# Patient Record
Sex: Female | Born: 1970 | Race: Black or African American | Hispanic: No | Marital: Single | Smoking: Former smoker
Health system: Southern US, Community
[De-identification: ages and names within clinical notes are randomized; demographics above are authoritative.]

## PROBLEM LIST (undated history)

## (undated) DIAGNOSIS — I10 Essential (primary) hypertension: Secondary | ICD-10-CM

## (undated) DIAGNOSIS — Z1231 Encounter for screening mammogram for malignant neoplasm of breast: Principal | ICD-10-CM

---

## 2015-04-04 NOTE — Nursing Note (Signed)
Nursing Discharge Summary - Text       Nursing Discharge Summary Entered On:  04/04/2015 15:49 EST    Performed On:  04/04/2015 15:47 EST by Romeo Apple, RN, Erin               DC Information   Discharge To, Anticipated :   Home independently   Devices/Equipment :   Other: ace wrap   Mode of Discharge :   Ambulatory   Transportation :   Private vehicle   Accompanied By :   Significant other   Romeo Apple RN, Erin - 04/04/2015 15:47 EST   Education   Responsible Learner(s) :   No Data Available     Barriers To Learning :   None evident   Teaching Method :   Explanation, Printed materials   Romeo Apple, RN, Erin - 04/04/2015 15:47 EST   Post-Hospital Education Adult Grid   Diagnostic Results :   Verbalizes understanding   Importance of Follow-Up Visits :   Verbalizes understanding   Pain Management :   Verbalizes understanding   When to Call Health Care Provider :   Trenton Gammon understanding   Romeo Apple, RN, Erin - 04/04/2015 15:47 EST   Education Referral Made To :   Primary Care Physician   Additional Learner(s) Present :   Significant other   Jaynee Eagles - 04/04/2015 15:47 EST

## 2015-09-14 NOTE — Nursing Note (Signed)
Nursing Discharge Summary - Text       Nursing Discharge Summary Entered On:  09/14/2015 11:40 EDT    Performed On:  09/14/2015 11:39 EDT by Romeo Apple, RN, Erin               DC Information   Discharge To, Anticipated :   Other: home with SO   Devices/Equipment :   Axillary crutches, Other: ace wrap   Mode of Discharge :   Wheelchair   Transportation :   Private vehicle   Accompanied By :   Significant other   Romeo Apple, RN, Erin - 09/14/2015 11:39 EDT   Education   Responsible Learner(s) :   No Data Available     Barriers To Learning :   None evident   Teaching Method :   Explanation, Printed materials   Jaynee Eagles - 09/14/2015 11:39 EDT   Post-Hospital Education Adult Grid   Diagnostic Results :   Trenton Gammon understanding   Equipment/Devices :   TEFL teacher understanding   Importance of Follow-Up Visits :   Verbalizes understanding   Pain Management :   Verbalizes understanding   When to Call Health Care Provider :   [employer] understanding   Romeo Apple, RN, Erin - 09/14/2015 11:39 EDT   Medication Education Adult Grid   Med Dosage, Route, Scheduling :   TEFL teacher understanding   Med Generic/Brand Name, Purpose, Action :   Verbalizes understanding   Medication Precautions :   Verbalizes understanding   Romeo Apple, Publishing copy - 09/14/2015 11:39 EDT   Education Referral Made To :   Primary Care Physician   Additional Learner(s) Present :   Significant other   Jaynee Eagles - 09/14/2015 11:39 EDT

## 2017-07-13 ENCOUNTER — Emergency Department (HOSPITAL_COMMUNITY): Payer: No Typology Code available for payment source

## 2017-07-13 ENCOUNTER — Encounter (HOSPITAL_COMMUNITY): Payer: Self-pay | Admitting: Emergency Medicine

## 2017-07-13 ENCOUNTER — Emergency Department (HOSPITAL_COMMUNITY)
Admission: EM | Admit: 2017-07-13 | Discharge: 2017-07-13 | Disposition: A | Discharge status: Home / Self Care | Payer: No Typology Code available for payment source | Attending: Emergency Medicine | Admitting: Emergency Medicine

## 2017-07-13 DIAGNOSIS — Y999 Unspecified external cause status: Secondary | ICD-10-CM | POA: Diagnosis not present

## 2017-07-13 DIAGNOSIS — S0990XA Unspecified injury of head, initial encounter: Secondary | ICD-10-CM | POA: Diagnosis present

## 2017-07-13 DIAGNOSIS — M7918 Myalgia, other site: Secondary | ICD-10-CM

## 2017-07-13 DIAGNOSIS — Z23 Encounter for immunization: Secondary | ICD-10-CM | POA: Insufficient documentation

## 2017-07-13 DIAGNOSIS — S00501A Unspecified superficial injury of lip, initial encounter: Secondary | ICD-10-CM | POA: Insufficient documentation

## 2017-07-13 DIAGNOSIS — M791 Myalgia, unspecified site: Secondary | ICD-10-CM | POA: Insufficient documentation

## 2017-07-13 DIAGNOSIS — Y92481 Parking lot as the place of occurrence of the external cause: Secondary | ICD-10-CM | POA: Diagnosis not present

## 2017-07-13 DIAGNOSIS — S60511A Abrasion of right hand, initial encounter: Secondary | ICD-10-CM | POA: Insufficient documentation

## 2017-07-13 DIAGNOSIS — T07XXXA Unspecified multiple injuries, initial encounter: Secondary | ICD-10-CM

## 2017-07-13 DIAGNOSIS — S60512A Abrasion of left hand, initial encounter: Secondary | ICD-10-CM | POA: Diagnosis not present

## 2017-07-13 DIAGNOSIS — I10 Essential (primary) hypertension: Secondary | ICD-10-CM | POA: Insufficient documentation

## 2017-07-13 DIAGNOSIS — Y9301 Activity, walking, marching and hiking: Secondary | ICD-10-CM | POA: Diagnosis not present

## 2017-07-13 HISTORY — DX: Essential (primary) hypertension: I10

## 2017-07-13 MED ORDER — TETANUS-DIPHTH-ACELL PERTUSSIS 5-2.5-18.5 LF-MCG/0.5 IM SUSP
0.5000 mL | Freq: Once | INTRAMUSCULAR | Status: AC
  Administered 2017-07-13: 0.5 mL via INTRAMUSCULAR
  Filled 2017-07-13: qty 0.5

## 2017-07-13 MED ORDER — ACETAMINOPHEN 500 MG PO TABS
1000.0000 mg | ORAL_TABLET | Freq: Once | ORAL | Status: AC
  Administered 2017-07-13: 1000 mg via ORAL
  Filled 2017-07-13: qty 2
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
## 2017-07-13 NOTE — Discharge Instructions (Addendum)
Please read attached information. If you experience any new or worsening signs or symptoms please return to the emergency room for evaluation. Please follow-up with your primary care provider or specialist as discussed.  °

## 2017-07-13 NOTE — ED Notes (Signed)
Pt ambulated to room with Trey Paula PA without difficulty.
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
## 2017-07-13 NOTE — ED Notes (Signed)
Called for patient for triage with no answer
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
## 2018-12-28 NOTE — ED Notes (Signed)
ED Triage Note       ED Secondary Triage Entered On:  12/28/2018 12:09 EST    Performed On:  12/28/2018 12:09 EST by Dyanne Iha               General Information   Barriers to Learning :   None evident   ED Home Meds Section :   Document assessment   [facility] ED Fall Risk Section :   Document assessment   ED Advance Directives Section :   Document assessment   ED Palliative Screen :   N/A (prefilled for <46yo)   Dyanne Iha - 12/28/2018 12:09 EST   (As Of: 12/28/2018 12:09:23 EST)   Problems(Active)    Chronic hypertension (IMO  :[id number] )  Name of Problem:   Chronic hypertension ; Recorder:   Lars Masson; Confirmation:   Confirmed ; Classification:   Medical ; Code:   [id number] ; Contributor System:   Conservation officer, nature ; Last Updated:   04/04/2015 13:34 EST ; Life Cycle Date:   04/04/2015 ; Life Cycle Status:   Active ; Vocabulary:   IMO          Diagnoses(Active)    Medical screening exam  Date:   12/28/2018 ; Diagnosis Type:   Reason For Visit ; Confirmation:   Complaint of ; Clinical Dx:   Medical screening exam ; Classification:   Medical ; Clinical Service:   Emergency medicine ; Code:   PNED ; Probability:   0 ; Diagnosis Code:   [id number]             -    Procedure History   (As Of: 12/28/2018 12:09:24 EST)     Anesthesia Minutes:   0 ; Procedure Name:   Bilateral tubal ligation ; Procedure Minutes:   0            [facility] Fall Risk Assessment Tool   Hx of falling last 3 months ED Fall :   No   Patient confused or disoriented ED Fall :   No   Patient intoxicated or sedated ED Fall :   No   Patient impaired gait ED Fall :   No   Use a mobility assistance device ED Fall :   No   Patient altered elimination ED Fall :   No   [facility] ED Fall Score :   0    Dyanne Iha - 12/28/2018 12:09 EST   ED Advance Directive   Advance Directive :   No   Dyanne Iha - 12/28/2018 12:09 EST

## 2018-12-28 NOTE — ED Notes (Signed)
ED Triage Note       ED Triage Adult Entered On:  12/28/2018 12:07 EST    Performed On:  12/28/2018 12:02 EST by Cato Mulligan               Triage   Chief Complaint :   Pt has not taken her BP med in three weeks.  denies any symptoms   Numeric Rating Pain Scale :   0 = No pain   [place] Mode of Arrival :   Private vehicle   Infectious Disease Documentation :   Document assessment   Temperature Oral :   36.6 degC(Converted to: 97.9 degF)    Heart Rate Monitored :   60 bpm   Respiratory Rate :   16 br/min   Systolic Blood Pressure :   153 mmHg (HI)    Diastolic Blood Pressure :   97 mmHg (HI)    SpO2 :   100 %   Oxygen Therapy :   Room air   Patient presentation :   None of the above   Chief Complaint or Presentation suggest infection :   No   Dosing Weight Obtained By :   Patient stated   Weight Dosing :   89.5 kg(Converted to: 197 lb 5 oz)    Height :   172 cm(Converted to: 5 ft 8 in)    Body Mass Index Dosing :   30 kg/m2   Cato Mulligan - 12/28/2018 12:02 EST   DCP GENERIC CODE   Tracking Acuity :   4   Tracking Group :   ED [employer] Tracking Group   Cato Mulligan - 12/28/2018 12:02 EST   ED General Section :   Document assessment   Pregnancy Status :   Patient denies   Last Menstrual Period :   12/22/2018 EST   ED Allergies Section :   Document assessment   ED Reason for Visit Section :   Document assessment   ED Home Meds Section :   Document assessment   Cato Mulligan - 12/28/2018 12:02 EST   ID Risk Screen Symptoms   Recent Travel History :   No recent travel   Close Contact with COVID-19 ID :   No   Last 14 days COVID-19 ID :   No   TB Symptom Screen :   No symptoms   C. diff Symptom/History ID :   Neither of the above   Patient Pregnant :   None of the above   MRSA/VRE Screening :   None of these apply   CRE Screening :   Not applicable   Cato Mulligan - 12/28/2018 12:02 EST   Allergies   (As Of: 12/28/2018 12:07:11 EST)   Allergies (Active)   No Known Medication Allergies  Estimated Onset Date:    Unspecified ; Created By:   Fritz Pickerel; Reaction Status:   Active ; Category:   Drug ; Substance:   No Known Medication Allergies ; Type:   Allergy ; Updated By:   Fritz Pickerel; Reviewed Date:   12/28/2018 12:04 EST        Psycho-Social   Last 3 mo, thoughts killing self/others :   Patient denies   Cato Mulligan - 12/28/2018 12:02 EST   ED Home Med List   Medication List   (As Of: 12/28/2018 12:07:11 EST)   Prescription/Discharge Order  diclofenac  :   diclofenac ; Status:   Prescribed ; Ordered As Mnemonic:   diclofenac sodium 75 mg oral delayed release tablet ; Simple Display Line:   75 mg, 1 tabs, Oral, BID, for 7 days, PRN: mild pain (1-3), 15 tabs, 0 Refill(s) ; Ordering Provider:   MOE-MD,  CHRISTOPHER B; Catalog Code:   diclofenac ; Order Dt/Tm:   07/15/2017 16:21:46 EDT            Home Meds    lisinopril  :   lisinopril ; Status:   Documented ; Ordered As Mnemonic:   lisinopril 20 mg oral tablet ; Simple Display Line:   20 mg, 1 tabs, Oral, Daily, 0 Refill(s) ; Catalog Code:   lisinopril ; Order Dt/Tm:   04/04/2015 13:34:42 EST            ED Reason for Visit   (As Of: 12/28/2018 12:07:11 EST)   Problems(Active)    Chronic hypertension (IMO  :[id number] )  Name of Problem:   Chronic hypertension ; Recorder:   Fritz Pickerel; Confirmation:   Confirmed ; Classification:   Medical ; Code:   [id number] ; Contributor System:   Dietitian ; Last Updated:   04/04/2015 13:34 EST ; Life Cycle Date:   04/04/2015 ; Life Cycle Status:   Active ; Vocabulary:   IMO          Diagnoses(Active)    Medical screening exam  Date:   12/28/2018 ; Diagnosis Type:   Reason For Visit ; Confirmation:   Complaint of ; Clinical Dx:   Medical screening exam ; Classification:   Medical ; Clinical Service:   Emergency medicine ; Code:   PNED ; Probability:   0 ; Diagnosis Code:   [id number]

## 2018-12-28 NOTE — ED Notes (Signed)
 ED Patient Education Note     Patient Education Materials Follows:  Cardiovascular     DASH Eating Plan    DASH stands for Dietary Approaches to Stop Hypertension. The DASH eating plan is a healthy eating plan that has been shown to reduce high blood pressure (hypertension). Additional health benefits may include reducing the risk of type 2 diabetes mellitus, heart disease, and stroke. The DASH eating plan may also help with weight loss.    WHAT DO I NEED TO KNOW ABOUT THE DASH EATING PLAN?    For the DASH eating plan, you will follow these general guidelines:     Choose foods with a percent daily value for sodium of less than 5% (as listed on the food label).     Use salt-free seasonings or herbs instead of table salt or sea salt.     Check with your health care provider or pharmacist before using salt substitutes.      Eat lower-sodium products, often labeled as lower sodium or no salt added.      Eat fresh foods.     Eat more vegetables, fruits, and low-fat dairy products.     Choose whole grains. Look for the word whole as the first word in the ingredient list.     Choose fish and skinless chicken or [place] more often than red meat. Limit fish, poultry, and meat to 6 oz (170 g) each day.     Limit sweets, desserts, sugars, and sugary drinks.     Choose heart-healthy fats.      Limit cheese to 1 oz (28 g) per day.     Eat more home-cooked food and less restaurant, buffet, and fast food.      Limit fried foods.     Cook foods using methods other than frying.     Limit canned vegetables. If you do use them, rinse them well to decrease the sodium.     When eating at a restaurant, ask that your food be prepared with less salt, or no salt if possible.     WHAT FOODS CAN I EAT?    Seek help from a dietitian for individual calorie needs.    Grains    Whole grain or whole wheat bread. Brown rice. Whole grain or whole wheat pasta. Quinoa, bulgur, and whole grain cereals. Low-sodium cereals. Corn or whole wheat  flour tortillas. Whole grain cornbread. Whole grain crackers. Low-sodium crackers.    Vegetables    Fresh or frozen vegetables (raw, steamed, roasted, or grilled). Low-sodium or reduced-sodium tomato and vegetable juices. Low-sodium or reduced-sodium tomato sauce and paste. Low-sodium or reduced-sodium canned vegetables.     Fruits    All fresh, canned (in natural juice), or frozen fruits.    Meat and Other Protein Products    Ground beef (85% or leaner), grass-fed beef, or beef trimmed of fat. Skinless chicken or [place]. Ground chicken or [place]. Pork trimmed of fat. All fish and seafood. Eggs. Dried beans, peas, or lentils. Unsalted nuts and seeds. Unsalted canned beans.    Dairy    Low-fat dairy products, such as skim or 1% milk, 2% or reduced-fat cheeses, low-fat ricotta or cottage cheese, or plain low-fat yogurt. Low-sodium or reduced-sodium cheeses.    Fats and Oils    Tub margarines without trans fats. Light or reduced-fat mayonnaise and salad dressings (reduced sodium). Avocado. Safflower, olive, or canola oils. Natural peanut or almond butter.    Other    Unsalted popcorn and  pretzels.    The items listed above may not be a complete list of recommended foods or beverages. Contact your dietitian for more options.    WHAT FOODS ARE NOT RECOMMENDED?    Grains    White bread. White pasta. White rice. Refined cornbread. Bagels and croissants. Crackers that contain trans fat.    Vegetables    Creamed or fried vegetables. Vegetables in a cheese sauce. Regular canned vegetables. Regular canned tomato sauce and paste. Regular tomato and vegetable juices.    Fruits    Dried fruits. Canned fruit in light or heavy syrup. Fruit juice.    Meat and Other Protein Products    Fatty cuts of meat. Ribs, chicken wings, bacon, sausage, bologna, salami, chitterlings, fatback, hot dogs, bratwurst, and packaged luncheon meats. Salted nuts and seeds. Canned beans with salt.    Dairy    Whole or 2% milk, cream, half-and-half, and  cream cheese. Whole-fat or sweetened yogurt. Full-fat cheeses or blue cheese. Nondairy creamers and whipped toppings. Processed cheese, cheese spreads, or cheese curds.    Condiments    Onion and garlic salt, seasoned salt, table salt, and sea salt. Canned and packaged gravies. Worcestershire sauce. Tartar sauce. Barbecue sauce. Teriyaki sauce. Soy sauce, including reduced sodium. Steak sauce. Fish sauce. Oyster sauce. Cocktail sauce. Horseradish. Ketchup and mustard. Meat flavorings and tenderizers. Bouillon cubes. Hot sauce. Tabasco sauce. Marinades. Taco seasonings. Relishes.    Fats and Oils    Butter, stick margarine, lard, shortening, ghee, and bacon fat. Coconut, palm kernel, or palm oils. Regular salad dressings.    Other    Pickles and olives. Salted popcorn and pretzels.    The items listed above may not be a complete list of foods and beverages to avoid. Contact your dietitian for more information.    WHERE CAN I FIND MORE INFORMATION?    National [employer], [employer], [employer]: [employer]    This information is not intended to replace advice given to you by your health care provider. Make sure you discuss any questions you have with your health care provider.    Document Released: 01/21/2011 Document Revised: 02/22/2014 Document Reviewed: 12/06/2012  Elsevier Interactive Patient Education ?2016 Else[employer] Inc.         Hypertension    Hypertension is another name for high blood pressure. High blood pressure forces your heart to work harder to pump blood. A blood pressure reading has two numbers, which includes a higher number over a lower number (example: 110/72).      HOME CARE     Have your blood pressure rechecked by your doctor.     Only take medicine as told by your doctor. Follow the directions carefully. The medicine does not work as well if you skip doses. Skipping doses also puts you at risk for problems.      Do not smoke.     Monitor your blood pressure  at home as told by your doctor.    GET HELP IF:     You think you are having a reaction to the medicine you are taking.     You have repeat headaches or feel dizzy.     You have puffiness (swelling) in your ankles.     You have trouble with your vision.    GET HELP RIGHT AWAY IF:     You get a very bad headache and are confused.     You feel weak, numb, or faint.     You get  chest or belly (abdominal) pain.     You throw up (vomit).     You cannot breathe very well.    MAKE SURE YOU:     Understand these instructions.     Will watch your condition.     Will get help right away if you are not doing well or get worse.    This information is not intended to replace advice given to you by your health care provider. Make sure you discuss any questions you have with your health care provider.    Document Released: 07/21/2007 Document Revised: 02/06/2013 Document Reviewed: 11/24/2012  Elsevier Interactive Patient Education ?2016 Elsevier Inc.

## 2018-12-28 NOTE — ED Provider Notes (Signed)
Medical screening exam        Patient:   Elizabeth Bruce, Elizabeth Bruce            MRN: [id number]            FIN: [id number]               Age:   48 years     Sex:  Female     DOB:  08-26-1970   Associated Diagnoses:   Medication refill; Hypertension; Non-compliance with treatment   Author:   Maudie Flakes      Basic Information   Additional information: Chief Complaint from Nursing Triage Note   Chief Complaint  Chief Complaint: Pt has not taken her BP med in three weeks.  denies any symptoms (12/28/18 12:02:00).      History of Present Illness   The patient presents with 48 year old female with history of hypertension presents with request for blood pressure medication refill.  She states she has been out of her blood pressure medication for 3 weeks.  She does have an upcoming appointment with the [facility] clinic in 3 weeks.  She denies any current symptoms.  She denies fever, chills, syncope, near syncope, headaches, altered mental status, stroke symptoms, chest pain or pressure, palpitations, shortness of breath, nausea, vomiting, urinary symptoms, pregnancy.        Review of Systems             Additional review of systems information: All other systems reviewed and otherwise negative.      Health Status   Allergies:    Allergic Reactions (Selected)  No Known Medication Allergies.      Past Medical/ Family/ Social History   Medical history: Reviewed as documented in chart.   Surgical history: Reviewed as documented in chart.   Family history: Not significant.   Social history: Reviewed as documented in chart.   Problem list:    Active Problems (1)  Chronic hypertension   .      Physical Examination               Vital Signs   Vital Signs   12/28/2018 12:09 EST Temperature Oral 36.6 degC   07/37/1062 69:48 EST Systolic Blood Pressure 546 mmHg  HI    Diastolic Blood Pressure 97 mmHg  HI    Temperature Oral 36.6 degC    Heart Rate Monitored 60 bpm    Respiratory Rate 16 br/min    SpO2 100 %   .   Measurements    12/28/2018 12:07 EST Body Mass Index est meas 30.25 kg/m2    Body Mass Index Measured 30.25 kg/m2   12/28/2018 12:02 EST Height/Length Measured 172 cm    Weight Dosing 89.5 kg   .   Basic Oxygen Information   12/28/2018 12:02 EST SpO2 100 %    Oxygen Therapy Room air   .   General:  Alert, no acute distress, Well-appearing.    Skin:  Warm, dry.    Cardiovascular:  Regular rate and rhythm, No murmur, Normal peripheral perfusion.    Respiratory:  Lungs are clear to auscultation, respirations are non-labored, breath sounds are equal.    Neurological:  Alert and oriented to person, place, time, and situation, normal speech observed.    Psychiatric:  Cooperative, appropriate mood & affect.       Medical Decision Making   Documents reviewed:  Emergency department nurses' notes.   Notes:  Patient is afebrile, hemodynamically stable and in  good condition.  She is well-appearing.  Nontoxic-appearing.  No signs of distress.  Request medication refill.  Denies any current symptoms.  We will provide her with a refill for her blood pressure medication.  Recommend follow-up as scheduled with her primary care provider.  Return to the ER immediately for any new or worsening symptoms.      Reexamination/ Reevaluation   Vital signs   Basic Oxygen Information   12/28/2018 12:02 EST SpO2 100 %    Oxygen Therapy Room air         Impression and Plan   Diagnosis   Medication refill (ICD10-CM Z76.0, Discharge, Medical)   Hypertension (ICD10-CM I10, Discharge, Medical)   Non-compliance with treatment (ICD10-CM Z91.19, Discharge, Medical)   Plan   Condition: Stable.    Disposition: Discharged: to home.    Prescriptions: [facility] prescriptions   Pharmacy:  lisinopril-hydrochlorothiazide 20 mg-12.5 mg oral tablet (Prescribe): 1 tabs, Oral, Daily, for 30 days, 30 tabs, 0 Refill(s).    Patient was given the following educational materials: Hypertension, Easy-to-Read, DASH Eating Plan.    Follow up with: [name] Within 1 to 2 weeks  Return to ED if symptoms worsen.    Counseled: I had a detailed discussion with the patient and/or guardian regarding the historical points/exam findings supporting the discharge diagnosis and need for outpatient followup. Discussed the need to return to the ER if symptoms persist/worsen, or for any questions/concerns that arise at home.    Signature Line     Electronically Signed on 12/28/2018 12:17 PM EST   ________________________________________________   Gearldine Bienenstock      Electronically Signed on 12/28/2018 12:36 PM EST   ________________________________________________   [place] Ruddle

## 2018-12-28 NOTE — Discharge Summary (Signed)
ED Clinical Summary                     [facility]  [address]  [place], [place] [address]  [phone]          PERSON INFORMATION  Name: Elizabeth Bruce, Elizabeth Bruce Age:  48 Years DOB: 1970/06/14   Sex: Female Language: English PCP: Elizabeth Bruce   Marital Status: Single Phone: [phone] Med Service: MED-Medicine   MRN: [id number] Acct# [id number] Arrival: 12/28/2018 11:57:00   Visit Reason: Medical screening exam; HBP Acuity: 4 LOS: 000 00:17   Address:    [address] [place] [address]   Diagnosis:    Hypertension; Medication refill; Non-compliance with treatment  Medications:          New Medications  Printed Prescriptions  lisinopril-hydrochlorothiazide (lisinopril-hydrochlorothiazide 20 mg-12.5 mg oral tablet) 1 Tabs Oral (given by mouth) every day for 30 Days. Refills: 0.  Last Dose:____________________  Medications that have not changed  Other Medications  diclofenac (diclofenac sodium 75 mg oral delayed release tablet) 1 Tabs Oral (given by mouth) 2 times a day as needed mild pain (1-3) for 7 Days. Refills: 0.  Last Dose:____________________  lisinopril (lisinopril 20 mg oral tablet) 1 Tabs Oral (given by mouth) every day.  Last Dose:____________________      Medications Administered During Visit:              Allergies      No Known Medication Allergies      Major Tests and Procedures:  The following procedures and tests were performed during your ED visit.  COMMON PROCEDURES%>  COMMON PROCEDURES COMMENTS%>                PROVIDER INFORMATION               Provider Role Assigned Threasa Heads ED Nurse 12/28/2018 12:02:55    Gearldine Bienenstock ED MidLevel 12/28/2018 12:04:04        Attending Physician:  DEFAULT,  DOCTOR      Admit Doc  DEFAULT,  DOCTOR     Consulting Doc       VITALS INFORMATION  Vital Sign Triage Latest   Temp Oral ORAL_1%> ORAL%>   Temp Temporal TEMPORAL_1%> TEMPORAL%>   Temp Intravascular  INTRAVASCULAR_1%> INTRAVASCULAR%>   Temp Axillary AXILLARY_1%> AXILLARY%>   Temp Rectal RECTAL_1%> RECTAL%>   02 Sat 100 % 100 %   Respiratory Rate RATE_1%> RATE%>   Peripheral Pulse Rate PULSE RATE_1%> PULSE RATE%>   Apical Heart Rate HEART RATE_1%> HEART RATE%>   Blood Pressure BLOOD PRESSURE_1%>/ BLOOD PRESSURE_1%>97 mmHg BLOOD PRESSURE%> / BLOOD PRESSURE%>97 mmHg                 Immunizations      No Immunizations Documented This Visit          DISCHARGE INFORMATION   Discharge Disposition: H Outpt-Sent Home   Discharge Location:  Home   Discharge Date and Time:  12/28/2018 12:14:37   ED Checkout Date and Time:  12/28/2018 12:14:37     DEPART REASON INCOMPLETE INFORMATION               Depart Action Incomplete Reason   Interactive View/I&O Recently assessed               Problems      Active           Chronic  hypertension              Smoking Status      Never smoker         PATIENT EDUCATION INFORMATION  Instructions:     DASH Eating Plan; Hypertension, Easy-to-Read     Follow up:                   With: Address: When:   Elizabeth Bruce [address] [place], [place] [address]  [phone] Business (1) Within 1 to 2 weeks   Comments:   Return to ED if symptoms worsen              ED PROVIDER DOCUMENTATION

## 2018-12-28 NOTE — ED Notes (Signed)
 ED Patient Summary       ;       [facility]  [address], [place], [place] [address]  [phone]  Discharge Instructions (Patient)  _______________________________________     Name: Elizabeth Bruce, Elizabeth Bruce  DOB: 1971-01-06                   MRN: [id number]                   FIN: [id number]  Reason For Visit: Medical screening exam; HBP  Final Diagnosis: Hypertension; Medication refill; Non-compliance with treatment     Visit Date: 12/28/2018 11:57:00  Address: [address] [place] [facility] [address]  Phone: [phone]     Emergency Department Providers:        Primary Physician:            [facility] would like to thank you for allowing us  to assist you with your healthcare needs. The following includes patient education materials and information regarding your injury/illness.     Follow-up Instructions:  You were seen today on an emergency basis. Please contact your primary care doctor for a follow up appointment. If you received a referral to a specialist doctor, it is important you follow-up as instructed.    It is important that you call your follow-up doctor to schedule and confirm the location of your next appointment. Your doctor may practice at multiple locations. The office location of your follow-up appointment may be different to the one written on your discharge instructions.    If you do not have a primary care doctor, please call [phone] for help in finding a [facility] Provider. For help in finding a specialist doctor, please call [phone].    The [facility] "Ask a Nurse" line in staffed by Registered Nurses and is a free service to the community. We are available Monday - Friday from 8am to 5pm to answer your questions about your health. Please call [phone].    If your condition gets worse before your follow-up with your primary care doctor or specialist, please return to the  Emergency Department.        Follow Up Appointments:  Primary Care Provider:      Name: NEHEMIAH DUNNINGS      Phone: [phone]                 With: Address: When:   Johnie JAYSON [place] [address] [place], [place] [address]  [phone] Business (1) Within 1 to 2 weeks   Comments:   Return to ED if symptoms worsen              Printed Prescriptions:    Patient Education Materials:  Discharge Orders          Discharge Patient 12/28/18 12:12:00 EST         Comment:      DASH Eating Plan; Hypertension, Easy-to-Read     DASH Eating Plan    DASH stands for Dietary Approaches to Stop Hypertension. The DASH eating plan is a healthy eating plan that has been shown to reduce high blood pressure (hypertension). Additional health benefits may include reducing the risk of type 2 diabetes mellitus, heart disease, and stroke. The DASH eating plan may also help with weight loss.    WHAT DO I NEED TO KNOW ABOUT THE DASH EATING PLAN?  For the DASH eating plan, you will follow these general guidelines:     Choose foods with a percent daily value for sodium of less than 5% (as listed on the food label).     Use salt-free seasonings or herbs instead of table salt or sea salt.     Check with your health care provider or pharmacist before using salt substitutes.      Eat lower-sodium products, often labeled as lower sodium or no salt added.      Eat fresh foods.     Eat more vegetables, fruits, and low-fat dairy products.     Choose whole grains. Look for the word whole as the first word in the ingredient list.     Choose fish and skinless chicken or [place] more often than red meat. Limit fish, poultry, and meat to 6 oz (170 g) each day.     Limit sweets, desserts, sugars, and sugary drinks.     Choose heart-healthy fats.      Limit cheese to 1 oz (28 g) per day.     Eat more home-cooked food and less restaurant, buffet, and fast food.      Limit fried foods.     Cook foods using methods other than frying.      Limit canned vegetables. If you do use them, rinse them well to decrease the sodium.     When eating at a restaurant, ask that your food be prepared with less salt, or no salt if possible.     WHAT FOODS CAN I EAT?    Seek help from a dietitian for individual calorie needs.    Grains    Whole grain or whole wheat bread. Brown rice. Whole grain or whole wheat pasta. Quinoa, bulgur, and whole grain cereals. Low-sodium cereals. Corn or whole wheat flour tortillas. Whole grain cornbread. Whole grain crackers. Low-sodium crackers.    Vegetables    Fresh or frozen vegetables (raw, steamed, roasted, or grilled). Low-sodium or reduced-sodium tomato and vegetable juices. Low-sodium or reduced-sodium tomato sauce and paste. Low-sodium or reduced-sodium canned vegetables.     Fruits    All fresh, canned (in natural juice), or frozen fruits.    Meat and Other Protein Products    Ground beef (85% or leaner), grass-fed beef, or beef trimmed of fat. Skinless chicken or [place]. Ground chicken or [place]. Pork trimmed of fat. All fish and seafood. Eggs. Dried beans, peas, or lentils. Unsalted nuts and seeds. Unsalted canned beans.    Dairy    Low-fat dairy products, such as skim or 1% milk, 2% or reduced-fat cheeses, low-fat ricotta or cottage cheese, or plain low-fat yogurt. Low-sodium or reduced-sodium cheeses.    Fats and Oils    Tub margarines without trans fats. Light or reduced-fat mayonnaise and salad dressings (reduced sodium). Avocado. Safflower, olive, or canola oils. Natural peanut or almond butter.    Other    Unsalted popcorn and pretzels.    The items listed above may not be a complete list of recommended foods or beverages. Contact your dietitian for more options.    WHAT FOODS ARE NOT RECOMMENDED?    Grains    White bread. White pasta. White rice. Refined cornbread. Bagels and croissants. Crackers that contain trans fat.    Vegetables    Creamed or fried vegetables. Vegetables in a cheese sauce. Regular canned  vegetables. Regular canned tomato sauce and paste. Regular tomato and vegetable juices.    Fruits    Dried  fruits. Canned fruit in light or heavy syrup. Fruit juice.    Meat and Other Protein Products    Fatty cuts of meat. Ribs, chicken wings, bacon, sausage, bologna, salami, chitterlings, fatback, hot dogs, bratwurst, and packaged luncheon meats. Salted nuts and seeds. Canned beans with salt.    Dairy    Whole or 2% milk, cream, half-and-half, and cream cheese. Whole-fat or sweetened yogurt. Full-fat cheeses or blue cheese. Nondairy creamers and whipped toppings. Processed cheese, cheese spreads, or cheese curds.    Condiments    Onion and garlic salt, seasoned salt, table salt, and sea salt. Canned and packaged gravies. Worcestershire sauce. Tartar sauce. Barbecue sauce. Teriyaki sauce. Soy sauce, including reduced sodium. Steak sauce. Fish sauce. Oyster sauce. Cocktail sauce. Horseradish. Ketchup and mustard. Meat flavorings and tenderizers. Bouillon cubes. Hot sauce. Tabasco sauce. Marinades. Taco seasonings. Relishes.    Fats and Oils    Butter, stick margarine, lard, shortening, ghee, and bacon fat. Coconut, palm kernel, or palm oils. Regular salad dressings.    Other    Pickles and olives. Salted popcorn and pretzels.    The items listed above may not be a complete list of foods and beverages to avoid. Contact your dietitian for more information.    WHERE CAN I FIND MORE INFORMATION?    National Heart, Lung, and Blood Institute: CableProm[employer].it    This information is not intended to replace advice given to you by your health care provider. Make sure you discuss any questions you have with your health care provider.    Document Released: 01/21/2011 Document Revised: 02/22/2014 Document Reviewed: 12/06/2012  Elsevier Interactive Patient Education ?2016 Elsevier Inc.       Hypertension    Hypertension is another name for high blood pressure. High blood pressure forces your  heart to work harder to pump blood. A blood pressure reading has two numbers, which includes a higher number over a lower number (example: 110/72).      HOME CARE     Have your blood pressure rechecked by your doctor.     Only take medicine as told by your doctor. Follow the directions carefully. The medicine does not work as well if you skip doses. Skipping doses also puts you at risk for problems.      Do not smoke.     Monitor your blood pressure at home as told by your doctor.    GET HELP IF:     You think you are having a reaction to the medicine you are taking.     You have repeat headaches or feel dizzy.     You have puffiness (swelling) in your ankles.     You have trouble with your vision.    GET HELP RIGHT AWAY IF:     You get a very bad headache and are confused.     You feel weak, numb, or faint.     You get chest or belly (abdominal) pain.     You throw up (vomit).     You cannot breathe very well.    MAKE SURE YOU:     Understand these instructions.     Will watch your condition.     Will get help right away if you are not doing well or get worse.    This information is not intended to replace advice given to you by your health care provider. Make sure you discuss any questions you have with your health care provider.    Document  Released: 07/21/2007 Document Revised: 02/06/2013 Document Reviewed: 11/24/2012  Elsevier Interactive Patient Education ?2016 Elsevier Inc.         Allergy Info: No Known Medication Allergies     Medication Information:  [facility] provided you with a complete list of medications post discharge, if you have been instructed to stop taking a medication please ensure you also follow up with this information to your Primary Care Physician.  Unless otherwise noted, patient will continue to take medications as prescribed prior to the Emergency Room visit.  Any specific questions regarding your chronic medications and dosages should be discussed with your  physician(s) and pharmacist.          diclofenac (diclofenac sodium 75 mg oral delayed release tablet) 1 Tabs Oral (given by mouth) 2 times a day as needed mild pain (1-3) for 7 Days. Refills: 0.  lisinopril (lisinopril 20 mg oral tablet) 1 Tabs Oral (given by mouth) every day.  lisinopril-hydrochlorothiazide  (lisinopril-hydrochlorothiazide  20 mg-12.5 mg oral tablet) 1 Tabs Oral (given by mouth) every day for 30 Days. Refills: 0.      Medications Administered During Visit:       Major Tests and Procedures:  The following procedures and tests were performed during your Emergency Room visit.  COMMON PROCEDURES%>  COMMON PROCEDURES COMMENTS%>          Laboratory Orders  No laboratory orders were placed.              Radiology Orders  No radiology orders were placed.              Patient Care Orders  Name Status Details   Discharge Patient Ordered 12/28/18 12:12:00 EST   ED Assessment Adult Completed 12/28/18 12:07:12 EST, 12/28/18 12:07:12 EST   ED Secondary Triage Completed 12/28/18 12:07:12 EST, 12/28/18 12:07:12 EST   ED Triage Adult Completed 12/28/18 11:58:02 EST, 12/28/18 11:58:02 EST       ---------------------------------------------------------------------------------------------------------------------  [facility]) encourages you to self-enroll in the [facility] Patient Portal.  [facility] Patient Portal will allow you to manage your personal health information securely from your own electronic device now and in the future.  To begin your Patient Portal enrollment process, please visit [email or web address] Click on "Sign up now" under [facility].  If you find that you need additional assistance on the [facility] Patient Portal or need a copy of your medical records, please call the [facility] Medical Records Office at [phone].  Comment:

## 2019-03-14 LAB — COVID-19, SURVEILLANCE (ASYMPTOMATIC/NO EXPOSURE, OR TEST OF CURE)
Lot/Kit Number: 706281
Lot/Kit expire date:: 9152021
SARS Cov2 Ag FIA: POSITIVE — AB

## 2019-03-14 NOTE — ED Notes (Signed)
ED Triage Note       ED Triage Adult Entered On:  03/14/2019 14:29 EST    Performed On:  03/14/2019 14:24 EST by Callie Fielding, RN, RACHAEL M               Triage   Chief Complaint :   c/o sinus pain, sinus drainage,cough- cant smell or taste after using nasal spray-sx x 3 days- wants covid test   Numeric Rating Pain Scale :   4   ED Pain Details :   Pain Details   [place] Mode of Arrival :   Walking   Infectious Disease Documentation :   Document assessment   Temperature Oral :   37.3 degC(Converted to: 99.1 degF)    Heart Rate Monitored :   96 bpm   Respiratory Rate :   15 br/min   Systolic Blood Pressure :   167 mmHg (HI)    Diastolic Blood Pressure :   112 mmHg (>HHI)    SpO2 :   96 %   Oxygen Therapy :   Room air   Patient presentation :   None of the above   Chief Complaint or Presentation suggest infection :   Yes   Dosing Weight Obtained By :   Patient stated   Weight Dosing :   89.5 kg(Converted to: 197 lb 5 oz)    Height :   172.7 cm(Converted to: 5 ft 8 in)    Body Mass Index Dosing :   30 kg/m2   Verrochi, RN, [place] M - 03/14/2019 14:24 EST   DCP GENERIC CODE   Tracking Acuity :   4   Tracking Group :   ED [place], RN, [employer] M - 03/14/2019 14:24 EST   ED General Section :   Document assessment   Pregnancy Status :   Patient denies   ED Allergies Section :   Document assessment   ED Reason for Visit Section :   Document assessment   Verrochi, RN, Thora Lance - 03/14/2019 14:24 EST   ID Risk Screen Symptoms   Recent Travel History :   No recent travel   Close Contact with COVID-19 ID :   No   Last 14 days COVID-19 ID :   No   TB Symptom Screen :   Cough AND Fever OR Chills   C. diff Symptom/History ID :   Neither of the above   Verrochi, RN, RACHAEL M - 03/14/2019 14:24 EST   ID TB Screen   Hemoptysis (Blood in Sputum) :   No   Night Sweats :   No   Weight Loss Greater Than 10 Pounds :   No   Hx of TB Now or at Any Time in the Past :   No   Verrochi, RN, RACHAEL M - 03/14/2019 14:24 EST    Allergies   (As Of: 03/14/2019 14:29:03 EST)   Allergies (Active)   No Known Medication Allergies  Estimated Onset Date:   Unspecified ; Created By:   Lars Masson; Reaction Status:   Active ; Category:   Drug ; Substance:   No Known Medication Allergies ; Type:   Allergy ; Updated By:   Lars Masson; Reviewed Date:   03/14/2019 14:25 EST        Pain Assessment   Preferred Pain Tool :   Numeric rating scale   Numeric Rating With Activity :   4  Numeric Rating Score With Activity :   4    Laterality :   Bilateral   Pain Location :   Other: sinus    Verrochi, RN, Earlene Plater - 03/14/2019 14:24 EST   Image 4 -  Images currently included in the form version of this document have not been included in the text rendition version of the form.   Psycho-Social   Last 3 mo, thoughts killing self/others :   Patient denies   Injuries/Abuse/Neglect in Household :   Denies   Feels Unsafe at Home :   No   ED Behavioral Activity Rating Scale :   4 - Quiet and awake (normal level of activity)   Verrochi, RN, Earlene Plater - 03/14/2019 14:24 EST   ED Reason for Visit   (As Of: 03/14/2019 14:29:03 EST)   Problems(Active)    Chronic hypertension (IMO  :[id number] )  Name of Problem:   Chronic hypertension ; Recorder:   Fritz Pickerel; Confirmation:   Confirmed ; Classification:   Medical ; Code:   [id number] ; Contributor System:   Dietitian ; Last Updated:   04/04/2015 13:34 EST ; Life Cycle Date:   04/04/2015 ; Life Cycle Status:   Active ; Vocabulary:   IMO          Diagnoses(Active)    Sinus Pain/Congestion  Date:   03/14/2019 ; Diagnosis Type:   Reason For Visit ; Confirmation:   Complaint of ; Clinical Dx:   Sinus Pain/Congestion ; Classification:   Medical ; Clinical Service:   Emergency medicine ; Code:   PNED ; Probability:   0 ; Diagnosis Code:   [phone]

## 2019-03-14 NOTE — ED Notes (Signed)
ED Triage Note       ED Secondary Triage Entered On:  03/14/2019 14:41 EST    Performed On:  03/14/2019 14:39 EST by Patrick North               General Information   Barriers to Learning :   None evident   Languages :   English   Influenza Vaccine Status :   Not received   ED Home Meds Section :   Document assessment   [facility] ED Fall Risk Section :   Document assessment   ED Advance Directives Section :   Document assessment   ED Palliative Screen :   N/A (prefilled for <65yo)   Patrick North - 03/14/2019 14:39 EST   (As Of: 03/14/2019 14:41:18 EST)   Problems(Active)    Chronic hypertension (IMO  :[id number] )  Name of Problem:   Chronic hypertension ; Recorder:   Fritz Pickerel; Confirmation:   Confirmed ; Classification:   Medical ; Code:   [id number] ; Contributor System:   Dietitian ; Last Updated:   04/04/2015 13:34 EST ; Life Cycle Date:   04/04/2015 ; Life Cycle Status:   Active ; Vocabulary:   IMO          Diagnoses(Active)    Sinus Pain/Congestion  Date:   03/14/2019 ; Diagnosis Type:   Reason For Visit ; Confirmation:   Complaint of ; Clinical Dx:   Sinus Pain/Congestion ; Classification:   Medical ; Clinical Service:   Emergency medicine ; Code:   PNED ; Probability:   0 ; Diagnosis Code:   [phone]             -    Procedure History   (As Of: 03/14/2019 14:41:18 EST)     Anesthesia Minutes:   0 ; Procedure Name:   Bilateral tubal ligation ; Procedure Minutes:   0            [facility] Fall Risk Assessment Tool   Hx of falling last 3 months ED Fall :   No   Patient confused or disoriented ED Fall :   No   Patient intoxicated or sedated ED Fall :   No   Patient impaired gait ED Fall :   No   Use a mobility assistance device ED Fall :   No   Patient altered elimination ED Fall :   No   [facility] ED Fall Score :   0    Patrick North - 03/14/2019 14:39 EST   ED Advance Directive   Advance Directive :   No   Patrick North - 03/14/2019 14:39 EST   Med Hx   Medication List   (As Of:  03/14/2019 14:41:18 EST)   Prescription/Discharge Order    lisinopril-hydrochlorothiazide  :   lisinopril-hydrochlorothiazide ; Status:   Completed ; Ordered As Mnemonic:   lisinopril-hydrochlorothiazide 20 mg-12.5 mg oral tablet ; Simple Display Line:   1 tabs, Oral, Daily, for 30 days, 30 tabs, 0 Refill(s) ; Ordering Provider:   Gearldine Bienenstock; Catalog Code:   lisinopril-hydrochlorothiazide ; Order Dt/Tm:   12/28/2018 12:11:58 EST          diclofenac  :   diclofenac ; Status:   Completed ; Ordered As Mnemonic:   diclofenac sodium 75 mg oral delayed release tablet ; Simple Display Line:   75 mg, 1 tabs, Oral, BID, for 7 days, PRN: mild pain (  1-3), 15 tabs, 0 Refill(s) ; Ordering Provider:   MOE-MD,  CHRISTOPHER B; Catalog Code:   diclofenac ; Order Dt/Tm:   07/15/2017 16:21:46 EDT            Home Meds    lisinopril  :   lisinopril ; Status:   Documented ; Ordered As Mnemonic:   lisinopril 20 mg oral tablet ; Simple Display Line:   20 mg, 1 tabs, Oral, Daily, 0 Refill(s) ; Catalog Code:   lisinopril ; Order Dt/Tm:   04/04/2015 13:34:42 EST

## 2019-03-14 NOTE — Discharge Summary (Signed)
ED Clinical Summary                     [address]  [address]  [place], [place] [address]  [phone]          PERSON INFORMATION  Name: Elizabeth Bruce, Elizabeth Bruce Age:  49 Years DOB: Apr 19, 1970   Sex: Female Language: English PCP: Rodney Booze   Marital Status: Single Phone: [phone] Med Service: Harle Stanford   MRN: [id number] Acct# [id number] Arrival: 03/14/2019 14:22:00   Visit Reason: Sinus Pain/Congestion; WANTS COVID TEST, LOSS OF SMELL AND TASTE Acuity: 4 LOS: 000 01:19   Address:    [address] [place] [address]   Diagnosis:    COVID-19 virus infection  Medications:          Medications that have not changed  Other Medications  lisinopril (lisinopril 20 mg oral tablet) 1 Tabs Oral (given by mouth) every day.  Last Dose:____________________      Medications Administered During Visit:              Allergies      No Known Medication Allergies      Major Tests and Procedures:  The following procedures and tests were performed during your ED visit.  COMMON PROCEDURES%>  COMMON PROCEDURES COMMENTS%>                PROVIDER INFORMATION               Provider Role Assigned Benancio Deeds ED Nurse 03/14/2019 14:32:42    NORRIS-MD, Lorri Frederick ED Provider 03/14/2019 14:35:12        Attending Physician:  Londell Moh      Admit Doc  NORRIS-MD,  Lorri Frederick     Consulting Doc       VITALS INFORMATION  Vital Sign Triage Latest   Temp Oral ORAL_1%> ORAL%>   Temp Temporal TEMPORAL_1%> TEMPORAL%>   Temp Intravascular INTRAVASCULAR_1%> INTRAVASCULAR%>   Temp Axillary AXILLARY_1%> AXILLARY%>   Temp Rectal RECTAL_1%> RECTAL%>   02 Sat 96 % 96 %   Respiratory Rate RATE_1%> RATE%>   Peripheral Pulse Rate PULSE RATE_1%> PULSE RATE%>   Apical Heart Rate HEART RATE_1%> HEART RATE%>   Blood Pressure BLOOD PRESSURE_1%>/ BLOOD PRESSURE_1%>112 mmHg BLOOD PRESSURE%> / BLOOD PRESSURE%>112 mmHg                 Immunizations      No Immunizations  Documented This Visit          DISCHARGE INFORMATION   Discharge Disposition: H Outpt-Sent Home   Discharge Location:  Home   Discharge Date and Time:  03/14/2019 15:41:03   ED Checkout Date and Time:  03/14/2019 15:41:03     DEPART REASON INCOMPLETE INFORMATION               Depart Action Incomplete Reason   Interactive View/I&O Recently assessed               Problems      Active           Chronic hypertension              Smoking Status      Never smoker         PATIENT EDUCATION INFORMATION  Instructions:      COVID Discharge Instructions (CUSTOM)     Follow up:  With: Address: When:   Return to Emergency Department  , only if needed   Comments:   Take all medications as prescribed. Drink plenty of fluids. Return to the ED immediately in the next 12-24 hours if symptoms worse in anyway or if any new symptoms develop. Arrange follow-up with your primary care for continuing management of today's complaints and for chronic medical issues     If you do not have a primary care call [phone] to arrange for a primary care physician     Please read all instructions as there may be items that were not fully discussed during your ED evaluation including all diagnosis and prescription medication instructions.              ED PROVIDER DOCUMENTATION     Patient:   Elizabeth Bruce            MRN: [id number]            FIN: [id number]               Age:   14 years     Sex:  Female     DOB:  1970/08/25   Associated Diagnoses:   COVID-19 virus infection   Author:   Londell Moh      Basic Information   Time seen: Provider Seen (ST)   ED Provider/Time:    Londell Moh / 03/14/2019 14:35  .   History source: Patient.   Arrival mode: Private vehicle.   History limitation: None.   Additional information: Chief Complaint from Nursing Triage Note   Chief Complaint  Chief Complaint: c/o sinus pain, sinus drainage,cough- cant smell or taste after using nasal spray-sx x 3 days- wants covid test  (03/14/19 14:24:00).      History of Present Illness   The patient presents with ear, nose, throat problem.  The onset was 3  days ago.  The course/duration of symptoms is constant.  Location: nose. The character of symptoms is swelling and Cannot smell.  The degree at present is minimal.  Risk factors consist of Recent intranasal medication use.  Prior episodes: rare.  Therapy today: none.  Associated symptoms: none.  Patient presents to the ED with complaints of inability to smell status post intranasal medication use a couple days ago.  Patient concern for possible COVID-19 just because of her current symptoms.  Patient denies any fever, cough, headache, vision change, shortness of breath, or other complaints at this time.        Review of Systems   Constitutional symptoms:  No generalized weakness,    Skin symptoms:  No rash, no dryness.    Eye symptoms:  Vision unchanged.   Respiratory symptoms:  No shortness of breath, no wheezing.    Gastrointestinal symptoms:  No nausea, no vomiting.    Genitourinary symptoms   Musculoskeletal symptoms:  No back pain, no Claudication.    Neurologic symptoms:  No headache, no altered level of consciousness.    Hematologic/Lymphatic symptoms:  Bleeding tendency negative, no petechiae.              Additional review of systems information: All other systems reviewed and otherwise negative.      Health Status   Allergies:    Allergic Reactions (Selected)  No Known Medication Allergies.   Medications:  (Selected)   Documented Medications  Documented  lisinopril 20 mg oral tablet: 20 mg, 1 tabs, Oral, Daily, 0 Refill(s).   Immunizations: Up  to date.      Past Medical/ Family/ Social History   Medical history:    No active or resolved past medical history items have been selected or recorded., Reviewed as documented in chart.   Surgical history:    Bilateral tubal ligation ([id number])., Reviewed as documented in chart.   Family history:    No family history items have been selected  or recorded., Reviewed as documented in chart.   Social history:    Social & Psychosocial Habits    Tobacco  03/12/2016  Use: Never smoker  , Reviewed as documented in chart.   Problem list:    Active Problems (1)  Chronic hypertension   , per nurse's notes.      Physical Examination               Vital Signs   Vital Signs   03/14/2019 14:42 EST Respiratory Rate 16 br/min   03/14/2019 14:24 EST Systolic Blood Pressure 167 mmHg  HI    Diastolic Blood Pressure 112 mmHg  >HHI    Temperature Oral 37.3 degC    Heart Rate Monitored 96 bpm    Respiratory Rate 15 br/min    SpO2 96 %   .   Measurements   03/14/2019 14:29 EST Body Mass Index est meas 30.01 kg/m2    Body Mass Index Measured 30.01 kg/m2   03/14/2019 14:24 EST Height/Length Measured 172.7 cm    Weight Dosing 89.5 kg   .   Basic Oxygen Information   03/14/2019 14:24 EST Oxygen Therapy Room air    SpO2 96 %   .   General:  Alert, no acute distress.    Skin:  Warm, dry, intact.    Head:  Normocephalic, atraumatic.    Neck:  Supple, trachea midline, no tenderness, no JVD.    Eye:  Pupils are equal, round and reactive to light, extraocular movements are intact.    Ears, nose, mouth and throat:  Bilateral nasal turbinate hypertrophy right greater than left, no active drainage or bleeding, no maxillary sinus tenderness to palpation.   Cardiovascular:  Normal peripheral perfusion, No edema.    Respiratory:  Respirations are non-labored, Symmetrical chest wall expansion.    Chest wall:  No tenderness, No deformity.    Back:  Nontender, Normal range of motion.    Musculoskeletal:  Normal ROM, no deformity.    Gastrointestinal:  Non distended.   Genitourinary   Neurological:  Alert and oriented to person, place, time, and situation, No focal neurological deficit observed.    Lymphatics:  No lymphadenopathy.   Psychiatric:  Cooperative, appropriate mood & affect.       Medical Decision Making   Differential Diagnosis:  Viral illness, sinusitis.   Rationale:  03/14/2019 14:56:49:  Based on the patient's presentation and history of present illness, patient will have laboratory analysis for further management of today's complaints.  Patient will be monitored for any signs of deterioration and we will adjust treatments accordingly.   Documents reviewed:  Emergency department nurses' notes, emergency department records, prior records.    Orders  Launch Order Profile (Selected)   Inpatient Orders  Ordered (In-Lab)  .SARS COV2 Ag FIA: .      Impression and Plan   Diagnosis   COVID-19 virus infection (ICD10-CM U07.1, Discharge, Medical)   Plan   Condition: Stable.    Disposition: Medically cleared, Discharged: Time  03/14/2019 15:30:00, to home.    Patient was given the following educational materials:  COVID  Discharge Instructions (CUSTOM).    Follow up with: Return to Emergency Department , only if needed Take all medications as prescribed. Drink plenty of fluids. Return to the ED immediately in the next 12-24 hours if symptoms worse in anyway or if any new symptoms develop. Arrange follow-up with your primary care for continuing management of today's complaints and for chronic medical issues    If you do not have a primary care call [phone] to arrange for a primary care physician     Please read all instructions as there may be items that were not fully discussed during your ED evaluation including all diagnosis and prescription medication instructions..    Counseled: Patient, Regarding diagnosis, Regarding diagnostic results, Regarding treatment plan, Regarding prescription, Patient indicated understanding of instructions.    Notes: I have spoken with the patient and/or caregivers. I have explained the patient's condition, diagnoses and treatment plan based on the information available to me at this time. I have answered the patient's and/or caregiver's questions and addressed any concerns. The patient and/or caregivers have as good an understanding of the patient's diagnosis, condition and  treatment plan as can be expected at this point. The vital signs have been stable. The patient's condition is stable and appropriate for discharge from the emergency department. The patient will pursue further outpatient evaluation with the primary care physician or other designated or consulting physician as outlined in the discharge instructions. The patient and/or caregivers are agreeable to this plan of care and follow-up instructions have been explained in detail. The patient and/or caregivers have received these instructions in written format and have expressed an understanding of the discharge instructions. The patient and/or caregivers are aware that any significant change in condition or worsening of symptoms should prompt an immediate return to this or the closest emergency department or a call to 911.

## 2019-03-14 NOTE — ED Notes (Signed)
ED Patient Education Note     Patient Education Materials Follows:                     COVID-19 Testing, Precautions, & Infection  q Your COVID-19 lab test result is PENDING.   Your COVID-19 lab result is not available at this time. Please allow up to 7 days for processing and continue to self-isolate at home for the next 10 days and no fever last 24 hours of isolation or unless you have a confirmed negative result (not detected). If your result is positive (detected) you will receive a phone call from [facility]. [employer]. Please follow all other discharge instructions given to you by your healthcare provider.    q Your COVID-19 lab test result is POSITIVE.    Your COVID-19 lab result is positive (detected) for the COVID-19 infection. Please follow all other discharge instructions given to you by your healthcare provider.  You must self-isolate at home for 10 days and no fever last 24 hours of isolation starting the day that you first began to feel sick. Minimize contact with others, including people that live in the same house.    q Your COVID-19 lab test result is NEGATIVE.   Your COVID-19 lab result is negative (not detected) for the COVID-19 infection. Please follow all other discharge instructions given to you by your healthcare provider    q Your COVID-19 antibody lab test is REACTIVE.   Your COVID-19 antibody lab result is reactive to the SARS-CoV-2 antibodies, the virus that causes the COVID-19 infection. A reactive result to the antibody test means that you have most likely had a previous COVID-19 infection but, DOES NOT mean that you have immunity from a future COVID-19 infection. Please follow all other discharge instructions given to you by your healthcare provider. If you have any additional questions about your reactive antibody result call your 'Primary Care Physician'.   q Your COVID-19 antibody lab test is NON-REACTIVE.   Your COVID-19 antibody lab result is non-reactive to the SARS-CoV-2  antibodies, the virus that causes the COVID-19 infection. A non-reactive result to the antibody test means that you HAVE NOT   had a previous COVID-19 infection. Please follow all other discharge instructions given to you by your healthcare provider. If you have any additional questions about your non-reactive antibody result call your 'Primary Care Physician'.  **** The quickest way to view or print your own test result is by the 'Patient Portal' at [email or web address] see next page for further 'Patient Portal' details.  Note: In reviewing your COVID-19 result on the patient portal (under the 'results' tab) you may notice a variation in the test name that includes, "Coronavirus", "cNoV", "Corona", "SARS", or "COV2". These all relate to a COVID-19 test that you had performed at a [facility]. [employer] location.  How to Access the [facility] Patient Portal  1. Go to: [email or web address]  2. You want the option of:  [facility]   If you do not have an account and are visiting the portal for the first time, select "Sign up now".   If you already have an account, select "Log into [facility]".       3. If you are signing up for a new account you will fill out a "Self-Enrollment for [facility]" page in which you will securely enter your first, last name, date of birth, social security number, and an identity verification prompt. *Tip:  when filling out the "Self-Enrollment  for [facility]" enter your name using the same spelling that is on file with your physician's office or hospital. Once this page is filled out, hit the purple "Next" button at the bottom of the page.   4. Verify your information and click the purple "Next Create Your Account" button.   5. After identity verification, you will create your username and password for your "[facility]" Patient Portal. You will then click the green "Create Account" button.   6. Once your account has been created, you  will be taken back to a login screen. Log in with the username and password that you created in step 5.     ! If you need additional assistance with the [facility] Patient Portal, you may call the [facility] at [phone].      What does self-isolate mean?   Avoid leaving the house unless seeking healthcare treatment. Things that you should NOT do include:  work, school, church, restaurants, and social gatherings   Do not share utensils, drinking glasses, towels, or bedding with other people   Wash/sanitize your hands frequently and avoid touching your face.   Frequently clean "high-touch" surfaces (e.g. counter tops, doorknobs, bathroom fixtures, phones, tablets, and keyboards).   Try to remain 6 feet away from others as much as possible.   Avoid sharing confined spaces with others as much as possible.  If you live in your home with other people, consider keeping a separate bedroom and bathroom just for your use.  What If My Symptoms Get Worse?  Follow-up with your doctor or return to the ER for any difficulty breathing or other worsening symptoms.  You may also see a doctor from home by going to [email or web address]      CDC COVID-19 Resources   For more information, visit.  [email or web address]       COVID-Related Education from the CDC  (Scan these codes with an Apple or Android device)           Prevent the spread of    What you should know  COVID-19 if you are sick:  about COVID-19 to        protect yourself and        others:                                                         COVID-19: Quarantine       10 things you can do to  vs. Isolation:         manage your COVID-19             symptoms at home:                                     What is COVID-19?  COVID-19 stands for "coronavirus disease 2019." It is caused by a virus called SARS-CoV-2. The virus first appeared in late 2019 and quickly spread around the world.  People with  COVID-19 can have fever, cough, trouble breathing, and other symptoms. Problems with breathing happen when the infection affects the lungs and causes pneumonia.  Most people who get COVID-19 will not get severely ill. But some do. In many areas, people have been told  to stay home and away from other people. This is to try to slow the spread of the virus.  How is COVID-19 Spread?    The virus that causes COVID-19 mainly spreads from person to person. This usually happens when an infected person coughs, sneezes, or talks near other people. The virus can be passed easily between people who live together. But it can also spread at gatherings where people are talking close together, shaking hands, hugging, sharing food, or even singing together. Doctors also think it is possible to get sick if you touch a surface that has the virus on it and then touch your mouth, nose, or eyes.  A person can be infected, and spread the virus to others, even without having any symptoms. This is why keeping people apart is one of the best ways to slow the spread.  It is also possible for the virus to spread from an infected person to an animal, like a pet. But this seems to be uncommon. There is no evidence that a person could get the virus from a pet.  Experts do not think the virus is spread through food like some other viruses. There is also no evidence that it can be spread through the water in a pool or hot tub. But because the virus can spread when people are close together, things like swimming in a crowded pool are still risky.  What are the Symptoms of COVID-19?    Symptoms usually start 4 or 5 days after a person is infected with the virus. But in some people, it can take up to 2 weeks for symptoms to appear. Some people never show symptoms at all.  When symptoms do happen, they can include:    Fever   Cough   Trouble breathing   Feeling tired   Shaking chills   Muscle aches   Headache   Sore throat   Problems with  sense of smell or taste         Some people have digestive problems like nausea or diarrhea. There have also been some reports of rashes or other skin symptoms. For example, some people with COVID-19 get reddish-purple spots on their fingers or toes. But it's not clear why or how often this happens.  For most people, symptoms will get better within a few weeks. But in others, COVID-19 can lead to serious problems like pneumonia, not getting enough oxygen, heart problems, or even death. This risk gets higher as people get older. It is also higher in people who have other health problems like serious heart disease, chronic kidney disease, type 2 diabetes, chronic obstructive pulmonary disease (COPD), sickle cell disease, or obesity. People who have a weak immune system for other reasons (for example, HIV infection or certain medicines), asthma, cystic fibrosis, type 1 diabetes, or high blood pressure might also be at higher risk for serious problems.    What should I do if I have symptoms?  If you have a fever, cough, trouble breathing, or other symptoms of COVID-19, call your doctor or nurse. They will ask about your symptoms. They might also ask about any recent travel and whether you have been around anyone who might be sick.  If your symptoms are not severe, it is best to call before you go in. The staff can tell you what to do and whether you need to be seen in person. Many people with only mild symptoms should stay home and avoid other people until they get  better. If you do need to go to the clinic or hospital, cover your nose and mouth with cloth. This helps protect other people. The staff might also have you wait someplace away from other people.  If you are severely ill and need to go to the clinic or hospital right away, you should still call ahead if possible. This way the staff can care for you while taking steps to protect others. If you think you are having a medical emergency, dial 9-1-1.  Is there a  test for the virus that causes COVID-19?  Yes. If your doctor or nurse suspects you have COVID-19, they might take a swab from inside your nose for testing. If you are coughing up mucus, they might also test a sample of the mucus. These tests can help your doctor figure out if you have COVID-19 or another illness.  In some areas, it might not be possible to test everyone who might have been exposed to the virus. If your doctor cannot test you, they might tell you to stay home, avoid other people, and call if your symptoms get worse.  There is also a blood test that can show if a person has had COVID-19 in the past. This is called an "antibody" test. Over time, this could help experts understand how many people were infected without knowing it. Experts are also using blood tests to study whether a person who has had COVID-19 could get it again.  How is COVID-19 treated?  Many people will be able to stay home while they get better. But people with serious symptoms or other health problems might need to go to the hospital.  Mild illness - Mild illness means you might have symptoms like fever and cough, but you do not have trouble breathing. Most people with COVID-19 have mild illness and can rest at home until they get better. This usually takes about 2 weeks, but it's not the same for everyone.  If you are recovering from COVID-19, it's important to stay home and "self-isolate" until your doctor or nurse tells you it's safe to go back to your normal activities. Self-isolation means staying apart from other people, even the people you live with. When you can stop self-isolation will depend on how long it has been since you had symptoms, and in some cases, whether you have had a negative test (showing that the virus is no longer in your body).  Severe illness - If you have more severe illness with trouble breathing, you might need to stay in the hospital, possibly in the intensive care unit (also called the "ICU").  While you are there, you will most likely be in a special isolation room. Only medical staff will be allowed in the room, and they will have to wear special gowns, gloves, masks, and eye protection.The doctors and nurses can monitor and support your breathing and other body functions and make you as comfortable as possible. You might need extra oxygen to help you breathe easily. If you are having a very hard time breathing, you might need to be put on a ventilator. This is a machine to help you breathe.  Doctors are studying several possible treatments for COVID-19. In certain cases, doctors might recommend medicines that seem to help some people who are severely ill. They also might recommend being part of a clinical trial. A clinical trial is a scientific study that tests new medicines to see how well they work. Do not try any new medicines  or treatments without talking to a doctor.  Can COVID-19 be prevented?  There is not yet a vaccine to prevent COVID-19. But there are things you can do to help slow the spread. These steps are a good idea for everyone, especially in areas where the infection has spread very quickly. But they are extra important for people who are older or who have other health problems.  To help protect yourself and others:  Practice "social distancing." It's most important to avoid contact with people who are sick. But social distancing also means staying away from all people who do not live in your household. It is sometimes called "physical distancing."  Avoiding crowds is an important part of social distancing. But even small gatherings can be risky, so it's best to stay home as much as you can. When you do need to go out, try your best to stay at least 6 feet (about 2 meters) away from other people.  Wear a cloth face mask when you need to go out. Experts in many countries recommend doing this. It is mostly so that if you are sick, even if you don't have any symptoms, you are less likely  to spread the infection to other people. You can use a cloth or homemade mask to cover your mouth and nose. In most cases, experts recommend leaving medical masks for health workers.  When you take your cloth mask off, make sure you do not touch your eyes, nose, or mouth. And wash your hands after you touch the mask. You can wash the cloth mask with the rest of your laundry.  Wash your hands with soap and water often and Avoid touching your face, especially your mouth, nose, and eyes. This is especially important after being out in public, getting your mail, or touching packages or other deliveries. The risk of getting infected by touching items like this is not well known, but is probably not very high. Still, it's a good idea to wash your hands often.    Make sure to rub your hands with soap for at least 20 seconds, cleaning your wrists, fingernails, and in between your fingers. Then rinse your hands and dry them with a paper towel you can throw away. If you are not near a sink, you can use a hand sanitizing gel to clean your hands. The gels with at least 60 percent alcohol work the best. But it is better to wash with soap and water if you can.  Avoid traveling if you can. Some experts recommend not traveling to or from certain areas where there are a lot of cases of COVID-19. But any form of travel, especially if you spend time in crowded places like airports, increases your risk. If lots of people travel, it also makes it more likely that the virus will spread to more parts of the world.    Why is social distancing so important?  Keeping people away from each other is one of the best ways to control the spread of the virus that causes COVID-19. That's because the virus can spread easily through close contact, and it's not always possible to know who is infected.  Different areas have different rules. In many places, schools and businesses are closed, and events have been canceled or postponed. But social  distancing is not just about avoiding big crowds. The safest thing to do is to avoid any gatherings with people from outside your household, even in small groups. Many people find it helpful  to stay in touch with friends and relatives in other ways, like over the phone or online. If you have outdoor space, or can take a walk without getting near other people, it can also help to get fresh air when you are able.  When experts recommend staying home, it's important to take this seriously and follow instructions as best you can. If you do need to be around other people, keep in mind that:   The virus can spread both indoors and outdoors. But being outdoors is probably less risky.   The more people you come into contact with, and the more often you do this, the higher the risk of spreading the virus.   Washing your hands often, staying 6 feet (2 meters) away from people, and wearing a cloth mask will all help lower the risk to you and others.  It's hard having to change your life and habits, and it's normal to want things to get back to the way they used to be. But remember, even if you do not get very sick from COVID-19, you could still spread it to others who could get very sick. If people stop social distancing too soon, more people will get sick.  What should I do if someone in my home has COVID-19?  If someone in your home has COVID-19, there are additional things you can do to protect yourself and others:   Keep the sick person away from others - The sick person should stay in a separate room, and use a different bathroom if possible. They should also eat in their own room.   Experts also recommend that the person stay away from pets in the house until they are better.   Have them cover their face - The sick person should cover their nose and mouth with a cloth mask when they are in the same room as other people. If they can't use a face cover, you can help protect yourself by covering your face when you are  in the room with them.   Wash hands [employer] your hands with soap and water often (see above).   Clean often - Here are some specific things that can help:  Wear disposable gloves when you clean. It's also a good idea to wear gloves when you have to touch the sick person's laundry, dishes, utensils, or trash.  When you do the sick person's laundry, avoid letting dirty clothes or bedding touch your body. Wash your hands and clean the outside of the washer after putting in the laundry.  Regularly clean things that are touched a lot. This includes counters, bedside tables, doorknobs, computers, phones, and bathroom surfaces.  Clean things in your home with soap and water, but also use disinfectants on appropriate surfaces. Some cleaning products work well to kill bacteria, but not viruses, so it's important to check labels.

## 2019-03-14 NOTE — ED Provider Notes (Signed)
ENT Problem *ED        Patient:   Elizabeth Bruce, Elizabeth Bruce            MRN: [id number]            FIN: [id number]               Age:   49 years     Sex:  Female     DOB:  1970/10/07   Associated Diagnoses:   COVID-19 virus infection   Author:   Serina Cowper      Basic Information   Time seen: Provider Seen (ST)   ED Provider/Time:    Serina Cowper / 03/14/2019 14:35  .   History source: Patient.   Arrival mode: Private vehicle.   History limitation: None.   Additional information: Chief Complaint from Nursing Triage Note   Chief Complaint  Chief Complaint: c/o sinus pain, sinus drainage,cough- cant smell or taste after using nasal spray-sx x 3 days- wants covid test (03/14/19 14:24:00).      History of Present Illness   The patient presents with ear, nose, throat problem.  The onset was 3  days ago.  The course/duration of symptoms is constant.  Location: nose. The character of symptoms is swelling and Cannot smell.  The degree at present is minimal.  Risk factors consist of Recent intranasal medication use.  Prior episodes: rare.  Therapy today: none.  Associated symptoms: none.  Patient presents to the ED with complaints of inability to smell status post intranasal medication use a couple days ago.  Patient concern for possible COVID-19 just because of her current symptoms.  Patient denies any fever, cough, headache, vision change, shortness of breath, or other complaints at this time.        Review of Systems   Constitutional symptoms:  No generalized weakness,    Skin symptoms:  No rash, no dryness.    Eye symptoms:  Vision unchanged.   Respiratory symptoms:  No shortness of breath, no wheezing.    Gastrointestinal symptoms:  No nausea, no vomiting.    Genitourinary symptoms   Musculoskeletal symptoms:  No back pain, no Claudication.    Neurologic symptoms:  No headache, no altered level of consciousness.    Hematologic/Lymphatic symptoms:  Bleeding tendency negative, no petechiae.               Additional review of systems information: All other systems reviewed and otherwise negative.      Health Status   Allergies:    Allergic Reactions (Selected)  No Known Medication Allergies.   Medications:  (Selected)   Documented Medications  Documented  lisinopril 20 mg oral tablet: 20 mg, 1 tabs, Oral, Daily, 0 Refill(s).   Immunizations: Up to date.      Past Medical/ Family/ Social History   Medical history:    No active or resolved past medical history items have been selected or recorded., Reviewed as documented in chart.   Surgical history:    Bilateral tubal ligation ([id number])., Reviewed as documented in chart.   Family history:    No family history items have been selected or recorded., Reviewed as documented in chart.   Social history:    Social & Psychosocial Habits    Tobacco  03/12/2016  Use: Never smoker  , Reviewed as documented in chart.   Problem list:    Active Problems (1)  Chronic hypertension   , per nurse's notes.  Physical Examination               Vital Signs   Vital Signs   03/14/2019 14:42 EST Respiratory Rate 16 br/min   03/14/2019 14:24 EST Systolic Blood Pressure 167 mmHg  HI    Diastolic Blood Pressure 112 mmHg  >HHI    Temperature Oral 37.3 degC    Heart Rate Monitored 96 bpm    Respiratory Rate 15 br/min    SpO2 96 %   .   Measurements   03/14/2019 14:29 EST Body Mass Index est meas 30.01 kg/m2    Body Mass Index Measured 30.01 kg/m2   03/14/2019 14:24 EST Height/Length Measured 172.7 cm    Weight Dosing 89.5 kg   .   Basic Oxygen Information   03/14/2019 14:24 EST Oxygen Therapy Room air    SpO2 96 %   .   General:  Alert, no acute distress.    Skin:  Warm, dry, intact.    Head:  Normocephalic, atraumatic.    Neck:  Supple, trachea midline, no tenderness, no JVD.    Eye:  Pupils are equal, round and reactive to light, extraocular movements are intact.    Ears, nose, mouth and throat:  Bilateral nasal turbinate hypertrophy right greater than left, no active drainage or bleeding, no  maxillary sinus tenderness to palpation.   Cardiovascular:  Normal peripheral perfusion, No edema.    Respiratory:  Respirations are non-labored, Symmetrical chest wall expansion.    Chest wall:  No tenderness, No deformity.    Back:  Nontender, Normal range of motion.    Musculoskeletal:  Normal ROM, no deformity.    Gastrointestinal:  Non distended.   Genitourinary   Neurological:  Alert and oriented to person, place, time, and situation, No focal neurological deficit observed.    Lymphatics:  No lymphadenopathy.   Psychiatric:  Cooperative, appropriate mood & affect.       Medical Decision Making   Differential Diagnosis:  Viral illness, sinusitis.   Rationale:  03/14/2019 14:56:49: Based on the patient's presentation and history of present illness, patient will have laboratory analysis for further management of today's complaints.  Patient will be monitored for any signs of deterioration and we will adjust treatments accordingly.   Documents reviewed:  Emergency department nurses' notes, emergency department records, prior records.    Orders  Launch Order Profile (Selected)   Inpatient Orders  Ordered (In-Lab)  .SARS COV2 Ag FIA: .      Impression and Plan   Diagnosis   COVID-19 virus infection (ICD10-CM U07.1, Discharge, Medical)   Plan   Condition: Stable.    Disposition: Medically cleared, Discharged: Time  03/14/2019 15:30:00, to home.    Patient was given the following educational materials:  COVID Discharge Instructions (CUSTOM).    Follow up with: Return to Emergency Department , only if needed Take all medications as prescribed. Drink plenty of fluids. Return to the ED immediately in the next 12-24 hours if symptoms worse in anyway or if any new symptoms develop. Arrange follow-up with your primary care for continuing management of today's complaints and for chronic medical issues    If you do not have a primary care call [phone] to arrange for a primary care physician     Please read all instructions as  there may be items that were not fully discussed during your ED evaluation including all diagnosis and prescription medication instructions..    Counseled: Patient, Regarding diagnosis, Regarding diagnostic results, Regarding treatment plan,  Regarding prescription, Patient indicated understanding of instructions.    Notes: I have spoken with the patient and/or caregivers. I have explained the patient's condition, diagnoses and treatment plan based on the information available to me at this time. I have answered the patient's and/or caregiver's questions and addressed any concerns. The patient and/or caregivers have as good an understanding of the patient's diagnosis, condition and treatment plan as can be expected at this point. The vital signs have been stable. The patient's condition is stable and appropriate for discharge from the emergency department. The patient will pursue further outpatient evaluation with the primary care physician or other designated or consulting physician as outlined in the discharge instructions. The patient and/or caregivers are agreeable to this plan of care and follow-up instructions have been explained in detail. The patient and/or caregivers have received these instructions in written format and have expressed an understanding of the discharge instructions. The patient and/or caregivers are aware that any significant change in condition or worsening of symptoms should prompt an immediate return to this or the closest emergency department or a call to 911.    Signature Line     Electronically Signed on 03/14/2019 03:31 PM EST   ________________________________________________   Londell Moh               Modified by: Londell Moh on 03/14/2019 03:31 PM EST

## 2019-03-14 NOTE — ED Notes (Signed)
 ED Patient Summary              [facility] Emergency Department  [address] [place] [address]  [phone]  Discharge Instructions (Patient)  _______________________________________     Name: Elizabeth Bruce, Elizabeth Bruce  DOB: 1971-01-25                   MRN: [id number]                   FIN: [phone]  Reason For Visit: Sinus Pain/Congestion; WANTS COVID TEST, LOSS OF SMELL AND TASTE  Final Diagnosis: COVID-19 virus infection     Visit Date: 03/14/2019 14:22:00  Address: [address] [place] [address]  Phone: [phone]     Emergency Department Providers:         Primary Physician:   NORRIS-MD, JUSTIN AARON         [facility] would like to thank you for allowing us  to assist you with your healthcare needs. The following includes patient education materials and information regarding your injury/illness.     Follow-up Instructions:  You were seen today on an emergency basis. Please contact your primary care doctor for a follow up appointment. If you received a referral to a specialist doctor, it is important you follow-up as instructed.    It is important that you call your follow-up doctor to schedule and confirm the location of your next appointment. Your doctor may practice at multiple locations. The office location of your follow-up appointment may be different to the one written on your discharge instructions.    If you do not have a primary care doctor, please call [phone] for help in finding a [facility] Provider. For help in finding a specialist doctor, please call [phone].    The [facility] "Ask a Nurse" line in staffed by Registered Nurses and is a free service to the community. We are available Monday - Friday from 8am to 5pm to answer your questions about your health. Please call [phone].    If your condition gets worse before your follow-up with your primary care doctor or specialist, please return  to the Emergency Department.        Coronavirus 2019 (COVID-19) Reminders:     Patients 70 and older can contact their [facility] doctors' offices to schedule appointments to receive the COVID-19 vaccine at the [facility] or send us  an email at [email or web address].            Scan this code with your phone camera to send an email to the address above.      Patients who are 80 and older who do not have a [facility] physician can call [phone] to schedule vaccination appointments.         Follow Up Appointments:  Primary Care Provider:      Name: NEHEMIAH DUNNINGS      Phone: [phone]                 With: Address: When:   Return to Emergency Department  , only if needed   Comments:   Take all medications as prescribed. Drink plenty of fluids. Return to the ED immediately in the next 12-24 hours if symptoms worse in anyway or if any new symptoms develop. Arrange follow-up with your primary care for continuing management of today's complaints and for chronic  medical issues     If you do not have a primary care call [phone] to arrange for a primary care physician     Please read all instructions as there may be items that were not fully discussed during your ED evaluation including all diagnosis and prescription medication instructions.              Printed Prescriptions:    Patient Education Materials:  Discharge Orders          Discharge Patient 03/14/19 15:31:00 EST         Comment:       COVID Discharge Instructions (CUSTOM)                     COVID-19 Testing, Precautions, & Infection  q Your COVID-19 lab test result is PENDING.   Your COVID-19 lab result is not available at this time. Please allow up to 7 days for processing and continue to self-isolate at home for the next 10 days and no fever last 24 hours of isolation or unless you have a confirmed negative result (not detected). If your result is positive (detected) you will receive a phone call  from [facility]. Please follow all other discharge instructions given to you by your healthcare provider.    q Your COVID-19 lab test result is POSITIVE.    Your COVID-19 lab result is positive (detected) for the COVID-19 infection. Please follow all other discharge instructions given to you by your healthcare provider.  You must self-isolate at home for 10 days and no fever last 24 hours of isolation starting the day that you first began to feel sick. Minimize contact with others, including people that live in the same house.    q Your COVID-19 lab test result is NEGATIVE.   Your COVID-19 lab result is negative (not detected) for the COVID-19 infection. Please follow all other discharge instructions given to you by your healthcare provider    q Your COVID-19 antibody lab test is REACTIVE.   Your COVID-19 antibody lab result is reactive to the SARS-CoV-2 antibodies, the virus that causes the COVID-19 infection. A reactive result to the antibody test means that you have most likely had a previous COVID-19 infection but, DOES NOT mean that you have immunity from a future COVID-19 infection. Please follow all other discharge instructions given to you by your healthcare provider. If you have any additional questions about your reactive antibody result call your 'Primary Care Physician'.   q Your COVID-19 antibody lab test is NON-REACTIVE.   Your COVID-19 antibody lab result is non-reactive to the SARS-CoV-2 antibodies, the virus that causes the COVID-19 infection. A non-reactive result to the antibody test means that you HAVE NOT   had a previous COVID-19 infection. Please follow all other discharge instructions given to you by your healthcare provider. If you have any additional questions about your non-reactive antibody result call your 'Primary Care Physician'.  **** The quickest way to view or print your own test result is by the 'Patient Portal' at [email or web address] see next page for  further 'Patient Portal' details.  Note: In reviewing your COVID-19 result on the patient portal (under the 'results' tab) you may notice a variation in the test name that includes, "Coronavirus", "cNoV", "Corona", "SARS", or "COV2". These all relate to a COVID-19 test that you had performed at a [facility] location.  How to Access the [facility] Patient Portal  1. Go to: [email or web address]  2. You want the option of:  [facility]   If you do not have an account and are visiting the portal for the first time, select "Sign up now".   If you already have an account, select "Log into [facility]".       3. If you are signing up for a new account you will fill out a "Self-Enrollment for [facility]" page in which you will securely enter your first, last name, date of birth, social security number, and an identity verification prompt. *Tip:  when filling out the "Self-Enrollment for [facility]" enter your name using the same spelling that is on file with your physician's office or hospital. Once this page is filled out, hit the purple "Next" button at the bottom of the page.   4. Verify your information and click the purple "Next Create Your Account" button.   5. After identity verification, you will create your username and password for your "[facility]" Patient Portal. You will then click the green "Create Account" button.   6. Once your account has been created, you will be taken back to a login screen. Log in with the username and password that you created in step 5.     ! If you need additional assistance with the [facility] Patient Portal, you may call the [facility] Office at [phone].      What does self-isolate mean?   Avoid leaving the house unless seeking healthcare treatment. Things that you should NOT do include:  work, school, church, restaurants, and social gatherings   Do not share utensils, drinking glasses,  towels, or bedding with other people   Wash/sanitize your hands frequently and avoid touching your face.   Frequently clean "high-touch" surfaces (e.g. counter tops, doorknobs, bathroom fixtures, phones, tablets, and keyboards).   Try to remain 6 feet away from others as much as possible.   Avoid sharing confined spaces with others as much as possible.  If you live in your home with other people, consider keeping a separate bedroom and bathroom just for your use.  What If My Symptoms Get Worse?  Follow-up with your doctor or return to the ER for any difficulty breathing or other worsening symptoms.  You may also see a doctor from home by going to [email or web address]      CDC COVID-19 Resources   For more information, visit.  [email or web address]       COVID-Related Education from the CDC  (Scan these codes with an Apple or Android device)           Prevent the spread of    What you should know  COVID-19 if you are sick:  about COVID-19 to        protect yourself and        others:                                                         COVID-19: Quarantine       10 things you can do to  vs. Isolation:         manage your COVID-19             symptoms at home:  What is COVID-19?  COVID-19 stands for coronavirus disease 2019. It is caused by a virus called SARS-CoV-2. The virus first appeared in late 2019 and quickly spread around the world.  People with COVID-19 can have fever, cough, trouble breathing, and other symptoms. Problems with breathing happen when the infection affects the lungs and causes pneumonia.  Most people who get COVID-19 will not get severely ill. But some do. In many areas, people have been told to stay home and away from other people. This is to try to slow the spread of the virus.  How is COVID-19 Spread?    The virus that causes COVID-19 mainly spreads from person to person. This usually happens when an infected person  coughs, sneezes, or talks near other people. The virus can be passed easily between people who live together. But it can also spread at gatherings where people are talking close together, shaking hands, hugging, sharing food, or even singing together. Doctors also think it is possible to get sick if you touch a surface that has the virus on it and then touch your mouth, nose, or eyes.  A person can be infected, and spread the virus to others, even without having any symptoms. This is why keeping people apart is one of the best ways to slow the spread.  It is also possible for the virus to spread from an infected person to an animal, like a pet. But this seems to be uncommon. There is no evidence that a person could get the virus from a pet.  Experts do not think the virus is spread through food like some other viruses. There is also no evidence that it can be spread through the water in a pool or hot tub. But because the virus can spread when people are close together, things like swimming in a crowded pool are still risky.  What are the Symptoms of COVID-19?    Symptoms usually start 4 or 5 days after a person is infected with the virus. But in some people, it can take up to 2 weeks for symptoms to appear. Some people never show symptoms at all.  When symptoms do happen, they can include:    Fever   Cough   Trouble breathing   Feeling tired   Shaking chills   Muscle aches   Headache   Sore throat   Problems with sense of smell or taste         Some people have digestive problems like nausea or diarrhea. There have also been some reports of rashes or other skin symptoms. For example, some people with COVID-19 get reddish-purple spots on their fingers or toes. But it's not clear why or how often this happens.  For most people, symptoms will get better within a few weeks. But in others, COVID-19 can lead to serious problems like pneumonia, not getting enough oxygen, heart problems, or even death. This risk  gets higher as people get older. It is also higher in people who have other health problems like serious heart disease, chronic kidney disease, type 2 diabetes, chronic obstructive pulmonary disease (COPD), sickle cell disease, or obesity. People who have a weak immune system for other reasons (for example, HIV infection or certain medicines), asthma, cystic fibrosis, type 1 diabetes, or high blood pressure might also be at higher risk for serious problems.    What should I do if I have symptoms?  If you have a fever, cough, trouble breathing, or other symptoms of COVID-19,  call your doctor or nurse. They will ask about your symptoms. They might also ask about any recent travel and whether you have been around anyone who might be sick.  If your symptoms are not severe, it is best to call before you go in. The staff can tell you what to do and whether you need to be seen in person. Many people with only mild symptoms should stay home and avoid other people until they get better. If you do need to go to the clinic or hospital, cover your nose and mouth with cloth. This helps protect other people. The staff might also have you wait someplace away from other people.  If you are severely ill and need to go to the clinic or hospital right away, you should still call ahead if possible. This way the staff can care for you while taking steps to protect others. If you think you are having a medical emergency, dial 9-1-1.  Is there a test for the virus that causes COVID-19?  Yes. If your doctor or nurse suspects you have COVID-19, they might take a swab from inside your nose for testing. If you are coughing up mucus, they might also test a sample of the mucus. These tests can help your doctor figure out if you have COVID-19 or another illness.  In some areas, it might not be possible to test everyone who might have been exposed to the virus. If your doctor cannot test you, they might tell you to stay home, avoid other people,  and call if your symptoms get worse.  There is also a blood test that can show if a person has had COVID-19 in the past. This is called an antibody test. Over time, this could help experts understand how many people were infected without knowing it. Experts are also using blood tests to study whether a person who has had COVID-19 could get it again.  How is COVID-19 treated?  Many people will be able to stay home while they get better. But people with serious symptoms or other health problems might need to go to the hospital.  Mild illness - Mild illness means you might have symptoms like fever and cough, but you do not have trouble breathing. Most people with COVID-19 have mild illness and can rest at home until they get better. This usually takes about 2 weeks, but it's not the same for everyone.  If you are recovering from COVID-19, it's important to stay home and self-isolate until your doctor or nurse tells you it's safe to go back to your normal activities. Self-isolation means staying apart from other people, even the people you live with. When you can stop self-isolation will depend on how long it has been since you had symptoms, and in some cases, whether you have had a negative test (showing that the virus is no longer in your body).  Severe illness - If you have more severe illness with trouble breathing, you might need to stay in the hospital, possibly in the intensive care unit (also called the ICU). While you are there, you will most likely be in a special isolation room. Only medical staff will be allowed in the room, and they will have to wear special gowns, gloves, masks, and eye protection.The doctors and nurses can monitor and support your breathing and other body functions and make you as comfortable as possible. You might need extra oxygen to help you breathe easily. If you are having a very hard  time breathing, you might need to be put on a ventilator. This is a machine to help you  breathe.  Doctors are studying several possible treatments for COVID-19. In certain cases, doctors might recommend medicines that seem to help some people who are severely ill. They also might recommend being part of a clinical trial. A clinical trial is a scientific study that tests new medicines to see how well they work. Do not try any new medicines or treatments without talking to a doctor.  Can COVID-19 be prevented?  There is not yet a vaccine to prevent COVID-19. But there are things you can do to help slow the spread. These steps are a good idea for everyone, especially in areas where the infection has spread very quickly. But they are extra important for people who are older or who have other health problems.  To help protect yourself and others:  Practice social distancing. It's most important to avoid contact with people who are sick. But social distancing also means staying away from all people who do not live in your household. It is sometimes called physical distancing.  Avoiding crowds is an important part of social distancing. But even small gatherings can be risky, so it's best to stay home as much as you can. When you do need to go out, try your best to stay at least 6 feet (about 2 meters) away from other people.  Wear a cloth face mask when you need to go out. Experts in many countries recommend doing this. It is mostly so that if you are sick, even if you don't have any symptoms, you are less likely to spread the infection to other people. You can use a cloth or homemade mask to cover your mouth and nose. In most cases, experts recommend leaving medical masks for health workers.  When you take your cloth mask off, make sure you do not touch your eyes, nose, or mouth. And wash your hands after you touch the mask. You can wash the cloth mask with the rest of your laundry.  Wash your hands with soap and water often and Avoid touching your face, especially your mouth, nose, and eyes. This is  especially important after being out in public, getting your mail, or touching packages or other deliveries. The risk of getting infected by touching items like this is not well known, but is probably not very high. Still, it's a good idea to wash your hands often.    Make sure to rub your hands with soap for at least 20 seconds, cleaning your wrists, fingernails, and in between your fingers. Then rinse your hands and dry them with a paper towel you can throw away. If you are not near a sink, you can use a hand sanitizing gel to clean your hands. The gels with at least 60 percent alcohol work the best. But it is better to wash with soap and water if you can.  Avoid traveling if you can. Some experts recommend not traveling to or from certain areas where there are a lot of cases of COVID-19. But any form of travel, especially if you spend time in crowded places like airports, increases your risk. If lots of people travel, it also makes it more likely that the virus will spread to more parts of the world.    Why is social distancing so important?  Keeping people away from each other is one of the best ways to control the spread of the virus that  causes COVID-19. That's because the virus can spread easily through close contact, and it's not always possible to know who is infected.  Different areas have different rules. In many places, schools and businesses are closed, and events have been canceled or postponed. But social distancing is not just about avoiding big crowds. The safest thing to do is to avoid any gatherings with people from outside your household, even in small groups. Many people find it helpful to stay in touch with friends and relatives in other ways, like over the phone or online. If you have outdoor space, or can take a walk without getting near other people, it can also help to get fresh air when you are able.  When experts recommend staying home, it's important to take this seriously and follow  instructions as best you can. If you do need to be around other people, keep in mind that:   The virus can spread both indoors and outdoors. But being outdoors is probably less risky.   The more people you come into contact with, and the more often you do this, the higher the risk of spreading the virus.   Washing your hands often, staying 6 feet (2 meters) away from people, and wearing a cloth mask will all help lower the risk to you and others.  It's hard having to change your life and habits, and it's normal to want things to get back to the way they used to be. But remember, even if you do not get very sick from COVID-19, you could still spread it to others who could get very sick. If people stop social distancing too soon, more people will get sick.  What should I do if someone in my home has COVID-19?  If someone in your home has COVID-19, there are additional things you can do to protect yourself and others:   Keep the sick person away from others - The sick person should stay in a separate room, and use a different bathroom if possible. They should also eat in their own room.   Experts also recommend that the person stay away from pets in the house until they are better.   Have them cover their face - The sick person should cover their nose and mouth with a cloth mask when they are in the same room as other people. If they can't use a face cover, you can help protect yourself by covering your face when you are in the room with them.   Wash hands [employer] your hands with soap and water often (see above).   Clean often - Here are some specific things that can help:  Wear disposable gloves when you clean. It's also a good idea to wear gloves when you have to touch the sick person's laundry, dishes, utensils, or trash.  When you do the sick person's laundry, avoid letting dirty clothes or bedding touch your body. Wash your hands and clean the outside of the washer after putting in the laundry.  Regularly  clean things that are touched a lot. This includes counters, bedside tables, doorknobs, computers, phones, and bathroom surfaces.  Clean things in your home with soap and water, but also use disinfectants on appropriate surfaces. Some cleaning products work well to kill bacteria, but not viruses, so it's important to check labels.                       Allergy Info: No Known Medication Allergies  Medication Information:  [facility] ED Physicians provided you with a complete list of medications post discharge, if you have been instructed to stop taking a medication please ensure you also follow up with this information to your Primary Care Physician.  Unless otherwise noted, patient will continue to take medications as prescribed prior to the Emergency Room visit.  Any specific questions regarding your chronic medications and dosages should be discussed with your physician(s) and pharmacist.          lisinopril (lisinopril 20 mg oral tablet) 1 Tabs Oral (given by mouth) every day.      Medications Administered During Visit:       Major Tests and Procedures:  The following procedures and tests were performed during your ED visit.  COMMON PROCEDURES%>  COMMON PROCEDURES COMMENTS%>          Laboratory Orders  Name Status Details   COV FIA Completed Nasal Swab, Stat, ST - Stat, 03/14/19 14:35:00 EST, 03/14/19 14:35:00 EST, Nurse collect, NORRIS-MD,  JUSTIN AARON, Print label Y/N               Radiology Orders  No radiology orders were placed.              Patient Care Orders  Name Status Details   COVID-19 Status Ordered 03/14/19 14:35:55 EST, NOT VALID FOR pharmacy, laboratory, radiology., 03/14/19 14:35:55 EST, COVID-19 Detected   Discharge Patient Ordered 03/14/19 15:31:00 EST   ED Assessment Adult Completed 03/14/19 14:29:04 EST, 03/14/19 14:29:04 EST   ED Secondary Triage Completed 03/14/19 14:29:04 EST, 03/14/19 14:29:04 EST   ED Triage Adult Completed 03/14/19 14:22:30 EST, 03/14/19 14:22:30 EST    Notify Provider Completed 03/14/19 14:35:55 EST, This message can only be seen by Nursing, it is not visible to Pharmacy, Laboratory, or Radiology., 03/14/19 14:35:55 EST   Place Isolation Order Ordered 03/14/19 14:35:56 EST, 03/14/19 14:35:56 EST       ---------------------------------------------------------------------------------------------------------------------  [facility]) encourages you to self-enroll in the [facility] Patient Portal.  [facility] Patient Portal will allow you to manage your personal health information securely from your own electronic device now and in the future.  To begin your Patient Portal enrollment process, please visit [email or web address] Click on "Sign up now" under [facility].  If you find that you need additional assistance on the [facility] Patient Portal or need a copy of your medical records, please call the [facility] Medical Records Office at [phone].  Comment:
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
## 2020-07-25 NOTE — ED Notes (Signed)
ED Patient Summary       ;       [facility]  [address] [place], [place] [address]  [phone]  Discharge Instructions (Patient)  Name: Elizabeth Bruce, Elizabeth Bruce  DOB: 1970-08-04                   MRN: [id number]                   FIN: [id number]  Reason For Visit: Elbow pain-swelling; LT ELBOW PAIN  Final Diagnosis: Tendonitis of elbow, left     Visit Date: 07/25/2020 13:09:00  Address: [address] [place] [facility] [place] [address]  Phone: [phone]     Emergency Department Providers:        Primary Physician:      [facility] would like to thank you for allowing [place] to assist you with your healthcare needs. The following includes patient education materials and information regarding your injury/illness.     Follow-up Instructions:  You were seen today on an emergency basis. Please contact your primary care doctor for a follow up appointment. If you received a referral to a specialist doctor, it is important you follow-up as instructed.    It is important that you call your follow-up doctor to schedule and confirm the location of your next appointment. Your doctor may practice at multiple locations. The office location of your follow-up appointment may be different to the one written on your discharge instructions.    If you do not have a primary care doctor, please call [phone] for help in finding a [facility] Provider. For help in finding a specialist doctor, please call [phone].    If your condition gets worse before your follow-up with your primary care doctor or specialist, please return to the Emergency Department.      Coronavirus 2019 (COVID-19) Reminders:     Patients age 19 - 42, with parental consent, and patients over age 31 can make an appointment for a COVID-19 vaccine. Patients can contact their [facility] doctors' offices to schedule an appointment to receive the  COVID-19 vaccine. Patients who do not have a [facility] physician can call [phone] to schedule vaccination appointments.      Follow Up Appointments:  Primary Care Provider:     Name: Rodney Booze     Phone: [phone]                 With: Address: When:   HEATHER MCINTOSH [address] [place], [place] [address]  [phone] Business (1) Within 1 week   Comments:   Return to ED if symptoms worsen              Post [facility] SERVICES%>          New Medications  Printed Prescriptions  diclofenac topical (diclofenac 1% topical gel) 1 Application Topical (on the skin) 4 times a day. Refills: 0.  Last Dose:____________________  methylPREDNISolone (Medrol Dosepak 4 mg oral tablet) 1 Packets Oral (given by mouth) every day for 6 Days. as directed on package labeling. Refills: 0.  Last Dose:____________________  naproxen (Naprosyn 375 mg oral tablet) 1 Tabs Oral (given by mouth) 2 times a day for 10 Days. Refills: 0.  Last Dose:____________________  Medications that have not changed  Other Medications  lisinopril (  lisinopril 20 mg oral tablet) 1 Tabs Oral (given by mouth) every day.  Last Dose:____________________      Allergy Info: No Known Medication Allergies     Discharge Additional Information          Discharge Patient 07/25/20 13:31:00 EDT      Patient Education Materials:        Tennis Elbow      Tennis elbow (lateral epicondylitis) is inflammation of tendons in your outer forearm, near your elbow. Tendons are tissues that connect muscle to bone. When you have tennis elbow, inflammation affects the tendons that you use to bend your wrist and move your hand up. Inflammation occurs in the lower part of the upper arm bone (humerus), where the tendons connect to the bone (lateral epicondyle).    Tennis elbow often affects people who play tennis, but anyone may get the condition from repeatedly extending the wrist or turning the forearm.      What are the causes?     This condition is usually caused by repeatedly extending the wrist, turning the forearm, and using the hands. It can result from sports or work that requires repetitive forearm movements. In some cases, it may be caused by a sudden injury.      What increases the risk?    You are more likely to develop tennis elbow if you play tennis or another racket sport. You also have a higher risk if you frequently use your hands for work. Besides people who play tennis, others at greater risk include:   People who use computers.     Holiday representative workers.     People who work in [employer].     Musicians.     Cooks.     Cashiers.        What are the signs or symptoms?    Symptoms of this condition include:   Pain and tenderness in the forearm and the outer part of the elbow. Pain may be felt only when using the arm, or it may be there all the time.     A burning feeling that starts in the elbow and spreads down the forearm.     A weak grip in the hand.        How is this diagnosed?    This condition is diagnosed based on your symptoms, your medical history, and a physical exam.    You may also have X-rays or an MRI to:   Confirm the diagnosis.     Look for other issues.     Check for tears in the ligaments, muscles, or tendons.        How is this treated?    Resting and icing your arm is often the first treatment. Your health care provider may also recommend:   Medicines to reduce pain and inflammation. These may be in the form of a pill, topical gels, or shots of a steroid medicine (cortisone).     An elbow strap to reduce stress on the area.     Physical therapy. This may include massage or exercises or both.     An elbow brace to restrict the movements that cause symptoms.      If these treatments do not help relieve your symptoms, your health care provider may recommend surgery to remove damaged muscle and reattach healthy muscle to bone.      Follow these instructions at home:    If you have a brace or strap:  Wear the  brace or strap as told by your health care provider. Remove it only as told by your health care provider.     Check the skin around the brace or strap every day. Tell your health care provider about any concerns.     Loosen the brace if your fingers tingle, become numb, or turn cold and blue.     Keep the brace clean.     If the brace or strap is not waterproof:  ? Do not let it get wet.    ? Cover it with a watertight covering when you take a bath or a shower.        Managing pain, stiffness, and swelling       If directed, put ice on the injured area. To do this:  ? If you have a removable brace or strap, remove it as told by your health care provider.    ? Put ice in a plastic bag.    ? Place a towel between your skin and the bag.    ? Leave the ice on for 20 minutes, 2?3 times a day.    ? Remove the ice if your skin turns bright red. This is very important. If you cannot feel pain, heat, or cold, you have a greater risk of damage to the area.       Move your fingers often to reduce stiffness and swelling.      Activity     Rest your elbow and wrist and avoid activities that cause symptoms as told by your health care provider.     Do physical therapy exercises as told by your health care provider.     If you lift an object, lift it with your palm facing up. This reduces stress on your elbow.      Lifestyle     If your tennis elbow is caused by sports, check your equipment and make sure that:  ? You use it correctly.    ? It is good match for you.       If your tennis elbow is caused by work or computer use, take frequent breaks to stretch your arm. Talk with your employer about ways to manage your condition at work.      General instructions     Take over-the-counter and prescription medicines only as told by your health care provider.     Do not use any products that contain nicotine or tobacco. These products include cigarettes, chewing tobacco, and vaping devices, such as e-cigarettes. If you need help  quitting, ask your health care provider.     Keep all follow-up visits. This is important.        How is this prevented?     Before and after activity:  ? Warm up and stretch before being active.    ? Cool down and stretch after being active.    ? Give your body time to rest between periods of activity.       During activity:  ? Make sure to use equipment that fits you.    ? If you play tennis, put power in your stroke with your lower body. Avoid using your arm only.       Maintain physical fitness, including:  ? Strength.    ? Flexibility.    ? Endurance.       Do exercises to strengthen the forearm muscles.      Contact a health care provider if:  You have pain that gets worse or does not get better with treatment.     You have numbness or weakness in your forearm, hand, or fingers.      Get help right away if:     Your pain is severe.     You cannot move your wrist.      Summary     Tennis elbow (lateral epicondylitis) is inflammation of tendons in your outer forearm, near your elbow.     Common symptoms include pain and tenderness in your forearm and the outer part of your elbow.     This condition is usually caused by repeatedly extending your wrist, turning your forearm, and using your hands.     The first treatment is often resting and icing your arm to relieve symptoms. Further treatment may include taking medicine, getting physical therapy, wearing a brace or strap, or having surgery.      This information is not intended to replace advice given to you by your health care provider. Make sure you discuss any questions you have with your health care provider.      Document Revised: 08/14/2019 Document Reviewed: 08/14/2019  Elsevier Patient Education ? 2021 Elsevier Inc.      ---------------------------------------------------------------------------------------------------------------------  [facility] allows patients to review your COVID and other test results as well as discharge documents from  any [facility], Emergency Department, surgical center or outpatient lab. Test results are typically available 36 hours after the test is completed.     [facility] encourages you to self-enroll in the [facility] Patient Portal.     To begin your self-enrollment process, please visit [email or web address] Under [facility], click on "Sign up now".     NOTE: You must be 16 years and older to use [facility] Self-Enroll online. If you are a parent, caregiver, or guardian; you need an invite to access your child's or dependent's health records. To obtain an invite, contact the Medical Records department at [phone] Monday through Friday, 8-4:30, select option 3 . If we receive your call afterhours, we will return your call the next business day.     If you have issues trying to create or access your account, contact [employer] support at [phone] available 7 days a week 24 hours a day.     Comment:

## 2020-07-25 NOTE — ED Notes (Signed)
ED Triage Note       ED Secondary Triage Entered On:  07/25/2020 13:25 EDT    Performed On:  07/25/2020 13:25 EDT by Lequita Halt, RN, Diane C               General Information   Barriers to Learning :   None evident   ED Home Meds Section :   Document assessment   [facility] ED Fall Risk Section :   Document assessment   ED Advance Directives Section :   Document assessment   ED Palliative Screen :   N/A (prefilled for <48yo)   Lequita Halt RN, Diane C - 07/25/2020 13:25 EDT   (As Of: 07/25/2020 13:25:46 EDT)   Problems(Active)    Chronic hypertension (IMO  :[id number] )  Name of Problem:   Chronic hypertension ; Recorder:   Fritz Pickerel; Confirmation:   Confirmed ; Classification:   Medical ; Code:   [id number] ; Contributor System:   Dietitian ; Last Updated:   04/04/2015 13:34 EST ; Life Cycle Date:   04/04/2015 ; Life Cycle Status:   Active ; Vocabulary:   IMO          Diagnoses(Active)    Elbow pain-swelling  Date:   07/25/2020 ; Diagnosis Type:   Reason For Visit ; Confirmation:   Complaint of ; Clinical Dx:   Elbow pain-swelling ; Classification:   Medical ; Clinical Service:   Emergency medicine ; Code:   PNED ; Probability:   0 ; Diagnosis Code:   [id number]             -    Procedure History   (As Of: 07/25/2020 13:25:46 EDT)     Anesthesia Minutes:   0 ; Procedure Name:   Bilateral tubal ligation ; Procedure Minutes:   0            [facility] Fall Risk Assessment Tool   Hx of falling last 3 months ED Fall :   No   Tressa Busman - 07/25/2020 13:25 EDT   ED Advance Directive   Advance Directive :   No   Lequita Halt, RN, Diane C - 07/25/2020 13:25 EDT   Med Hx   Medication List   (As Of: 07/25/2020 13:25:46 EDT)   Home Meds    lisinopril  :   lisinopril ; Status:   Documented ; Ordered As Mnemonic:   lisinopril 20 mg oral tablet ; Simple Display Line:   20 mg, 1 tabs, Oral, Daily, 0 Refill(s) ; Catalog Code:   lisinopril ; Order Dt/Tm:   04/04/2015 13:34:42 EST

## 2020-07-25 NOTE — ED Provider Notes (Signed)
General Medical Problem *ED        Patient:   Elizabeth Bruce, Elizabeth Bruce            MRN: [id number]            FIN: [id number]               Age:   50 years     Sex:  Female     DOB:  08-Apr-1970   Associated Diagnoses:   Tendonitis of elbow, left   Author:   Loleta Books      Basic Information   Time seen: Provider Seen (ST)   ED Provider/Time:    Rick Carruthers,  Destenee Guerry-DO / 07/25/2020 13:11  .   Additional information: Chief Complaint from Nursing Triage Note   Chief Complaint  Chief Complaint: pt reports left elbow joint pain x few weeks. states worse when she tries to pick something up or when she moves fingers. denies injury (07/25/20 13:15:00).      History of Present Illness   50 year old female who has been using her left arm more at work and is some slight irritation in the outer aspect of her elbow worse with rotating her hand back-and-forth, notes no fall no injury no numbness or tingling.  No redness or warmth to the area.  No loss of function or motion.  Improved with rest ice compression elevation NSAIDs.  Otherwise well nonprogressive.      Review of Systems   Constitutional symptoms:  No fever, no chills, no sweats, no generalized weakness, no fatigue, no decreased activity.    Skin symptoms:  No jaundice, no rash.    Eye symptoms:  Negative except as documented in HPI.   ENMT symptoms:  Negative except as documented in HPI.   Respiratory symptoms:  Negative except as documented in HPI.   Cardiovascular symptoms:  No chest pain, no palpitations, no tachycardia, no syncope, no diaphoresis.    Gastrointestinal symptoms:  No abdominal pain, no nausea, no vomiting, no diarrhea.    Genitourinary symptoms:  Negative except as documented in HPI.   Musculoskeletal symptoms:  Muscle pain, Joint pain, No back pain,    Neurologic symptoms:  No headache, no dizziness, no altered level of consciousness, no numbness, no tingling, no focal weakness.    Psychiatric symptoms:  No anxiety,              Additional review of systems  information: All other systems reviewed and otherwise negative.      Health Status   Allergies:    Allergic Reactions (Selected)  No Known Medication Allergies.      Past Medical/ Family/ Social History   Medical history: Reviewed as documented in chart.   Surgical history: Reviewed as documented in chart.   Family history: Not significant.   Social history: Reviewed as documented in chart.   Problem list:    Active Problems (1)  Chronic hypertension   .      Physical Examination               Vital Signs   Vital Signs   07/25/2020 13:25 EDT Respiratory Rate 16 br/min   07/25/2020 13:15 EDT Systolic Blood Pressure 117 mmHg    Diastolic Blood Pressure 81 mmHg    Temperature Oral 37.1 degC    Heart Rate Monitored 75 bpm    Respiratory Rate 16 br/min    SpO2 99 %   .   Measurements   07/25/2020 13:18 EDT Body  Mass Index est meas 31.01 kg/m2    Body Mass Index Measured 31.01 kg/m2   07/25/2020 13:15 EDT Height/Length Measured 172.72 cm    Weight Dosing 92.5 kg   .   Basic Oxygen Information   07/25/2020 13:15 EDT Oxygen Therapy Room air    SpO2 99 %   .   General:  Alert, no acute distress.    Skin:  Pink, intact.    Head:  Atraumatic.   Eye:  Normal conjunctiva.   Ears, nose, mouth and throat:  Oral mucosa moist.   Respiratory:  Respirations are non-labored.   Back:  Nontender, Normal range of motion, Normal alignment.    Musculoskeletal:  Normal ROM, normal strength, no swelling, no deformity, Slight tenderness to the left lateral supracondylar space.    Neurological:  Normal sensory observed, normal motor observed.       Reexamination/ Reevaluation   Course: improving.   Pain status: decreased.   Assessment: exam improved.      Impression and Plan   Diagnosis   Tendonitis of elbow, left (ICD10-CM M77.8, Discharge, Medical)   Plan   Condition: Improved, Stable.    Disposition: Discharged: to home.    Prescriptions: Launch prescriptions   Pharmacy:  Naprosyn 375 mg oral tablet (Prescribe): 375 mg, 1 tabs, Oral, BID, for 10  days, 20 tabs, 0 Refill(s)  diclofenac 1% topical gel (Prescribe): 1 app, Topical, QID, 100 g, 0 Refill(s)  Medrol Dosepak 4 mg oral tablet (Prescribe): 1 packets, Oral, Daily, for 6 days, as directed on package labeling, 21 tabs, 0 Refill(s).    Patient was given the following educational materials: Tennis Elbow.    Follow up with: HEATHER MCINTOSH Within 1 week Return to ED if symptoms worsen.    Counseled: Patient, I had a detailed discussion with the patient and/or guardian regarding the historical points/exam findings supporting the discharge diagnosis and need for outpatient followup. Discussed the need to return to the ER if symptoms persist/worsen, or for any questions/concerns that arise at home.    Signature Line     Electronically Signed on 07/28/2020 07:49 AM EDT   ________________________________________________   Loleta Books               Modified by: Loleta Books on 07/28/2020 07:49 AM EDT

## 2020-07-25 NOTE — Discharge Summary (Signed)
ED Clinical Summary                     [facility]  [address]  [place], [place], [address]  [phone]          PERSON INFORMATION  Name: Elizabeth Bruce, Elizabeth Bruce Age:  50 Years DOB: 1970-12-24   Sex: Female Language: English PCP: Rodney Booze   Marital Status: Single Phone: [phone] Med Service: MED-Medicine   MRN: [id number] Acct# [id number] Arrival: 07/25/2020 13:09:00   Visit Reason: Elbow pain-swelling; LT ELBOW PAIN Acuity: 4 LOS: 000 00:28   Address:    [address] [place] [address]   Diagnosis:    Tendonitis of elbow, left  Medications:          New Medications  Printed Prescriptions  diclofenac topical (diclofenac 1% topical gel) 1 Application Topical (on the skin) 4 times a day. Refills: 0.  Last Dose:____________________  methylPREDNISolone (Medrol Dosepak 4 mg oral tablet) 1 Packets Oral (given by mouth) every day for 6 Days. as directed on package labeling. Refills: 0.  Last Dose:____________________  naproxen (Naprosyn 375 mg oral tablet) 1 Tabs Oral (given by mouth) 2 times a day for 10 Days. Refills: 0.  Last Dose:____________________  Medications that have not changed  Other Medications  lisinopril (lisinopril 20 mg oral tablet) 1 Tabs Oral (given by mouth) every day.  Last Dose:____________________      Medications Administered During Visit:              Allergies      No Known Medication Allergies      Major Tests and Procedures:  The following procedures and tests were performed during your ED visit.  COMMON PROCEDURES%>  COMMON PROCEDURES COMMENTS%>                PROVIDER INFORMATION               Provider Role Assigned Lowanda Foster ED Provider 07/25/2020 13:11:03    Lequita Halt RN, Cindie Crumbly ED Nurse 07/25/2020 13:22:12        Attending Physician:  Loleta Books      Admit Doc  POTTER,  ANDREW-DO     Consulting Doc       VITALS INFORMATION  Vital Sign Triage Latest   Temp Oral ORAL_1%> ORAL%>   Temp  Temporal TEMPORAL_1%> TEMPORAL%>   Temp Intravascular INTRAVASCULAR_1%> INTRAVASCULAR%>   Temp Axillary AXILLARY_1%> AXILLARY%>   Temp Rectal RECTAL_1%> RECTAL%>   02 Sat 99 % 99 %   Respiratory Rate RATE_1%> RATE%>   Peripheral Pulse Rate PULSE RATE_1%> PULSE RATE%>   Apical Heart Rate HEART RATE_1%> HEART RATE%>   Blood Pressure BLOOD PRESSURE_1%>/ BLOOD PRESSURE_1%>81 mmHg BLOOD PRESSURE%> / BLOOD PRESSURE%>81 mmHg                 Immunizations      No Immunizations Documented This Visit          DISCHARGE INFORMATION   Discharge Disposition: H Outpt-Sent Home   Discharge Location:  Home   Discharge Date and Time:  07/25/2020 13:37:57   ED Checkout Date and Time:  07/25/2020 13:37:57     DEPART REASON INCOMPLETE INFORMATION               Depart Action Incomplete Reason   Interactive View/I&O Recently assessed  Problems      Active           Chronic hypertension              Smoking Status      Never smoker         PATIENT EDUCATION INFORMATION  Instructions:     Tennis Elbow     Follow up:                   With: Address: When:   HEATHER MCINTOSH [address], [address] [place] [address]  [phone] Business (1) Within 1 week   Comments:   Return to ED if symptoms worsen              ED PROVIDER DOCUMENTATION

## 2020-07-25 NOTE — ED Notes (Signed)
ED Triage Note       ED Triage Adult Entered On:  07/25/2020 13:18 EDT    Performed On:  07/25/2020 13:15 EDT by Conni Slipper, RN, Gwendolyn Fill               Triage   Numeric Rating Pain Scale :   5 = Moderate pain   Chief Complaint :   Elizabeth Bruce reports left elbow joint pain x few weeks. states worse when she tries to pick something up or when she moves fingers. denies injury   [place] Mode of Arrival :   Private vehicle   Infectious Disease Documentation :   Document assessment   Temperature Oral :   37.1 degC(Converted to: 98.8 degF)    Heart Rate Monitored :   75 bpm   Respiratory Rate :   16 br/min   Systolic Blood Pressure :   117 mmHg   Diastolic Blood Pressure :   81 mmHg   SpO2 :   99 %   Oxygen Therapy :   Room air   Patient presentation :   None of the above   Chief Complaint or Presentation suggest infection :   No   Weight Dosing :   92.5 kg(Converted to: 203 lb 15 oz)    Height :   172.72 cm(Converted to: 5 ft 8 in)    Body Mass Index Dosing :   31 kg/m2   Toney Reil - 07/25/2020 13:15 EDT   DCP GENERIC CODE   Tracking Acuity :   4   Tracking Group :   ED [facility] Tracking Group   [place], RN, Gwendolyn Fill - 07/25/2020 13:15 EDT   ED General Section :   Document assessment   Pregnancy Status :   Patient denies   ED Allergies Section :   Document assessment   ED Reason for Visit Section :   Document assessment   ED Quick Assessment :   Patient appears awake, alert, oriented to baseline. Skin warm and dry. Moves all extremities. Respiration even and unlabored. Appears in no apparent distress.   Conni Slipper, RN, Gwendolyn Fill - 07/25/2020 13:15 EDT   ID Risk Screen Symptoms   Recent Travel History :   No recent travel   TB Symptom Screen :   No symptoms   Toney Reil - 07/25/2020 13:15 EDT   Allergies   (As Of: 07/25/2020 13:18:33 EDT)   Allergies (Active)   No Known Medication Allergies  Estimated Onset Date:   Unspecified ; Created By:   Fritz Pickerel; Reaction Status:   Active ; Category:   Drug ; Substance:    No Known Medication Allergies ; Type:   Allergy ; Updated By:   Fritz Pickerel; Reviewed Date:   07/25/2020 13:16 EDT        Psycho-Social   Last 3 mo, thoughts killing self/others :   Patient denies   Right click within box for Suspected Abuse policy link. :   None   Feels Safe Where Live :   Yes   ED Behavioral Activity Rating Scale :   4 - Quiet and awake (normal level of activity)   Toney Reil - 07/25/2020 13:15 EDT   ED Reason for Visit   (As Of: 07/25/2020 13:18:33 EDT)   Problems(Active)    Chronic hypertension (IMO  :[id number] )  Name of Problem:   Chronic hypertension ; Recorder:   Everlene Balls  L; Confirmation:   Confirmed ; Classification:   Medical ; Code:   [id number] ; Contributor System:   Dietitian ; Last Updated:   04/04/2015 13:34 EST ; Life Cycle Date:   04/04/2015 ; Life Cycle Status:   Active ; Vocabulary:   IMO          Diagnoses(Active)    Elbow pain-swelling  Date:   07/25/2020 ; Diagnosis Type:   Reason For Visit ; Confirmation:   Complaint of ; Clinical Dx:   Elbow pain-swelling ; Classification:   Medical ; Clinical Service:   Emergency medicine ; Code:   PNED ; Probability:   0 ; Diagnosis Code:   [id number]

## 2020-07-25 NOTE — ED Notes (Signed)
ED Patient Education Note     Patient Education Materials Follows:  Orthopedics     Tennis Elbow      Tennis elbow (lateral epicondylitis) is inflammation of tendons in your outer forearm, near your elbow. Tendons are tissues that connect muscle to bone. When you have tennis elbow, inflammation affects the tendons that you use to bend your wrist and move your hand up. Inflammation occurs in the lower part of the upper arm bone (humerus), where the tendons connect to the bone (lateral epicondyle).    Tennis elbow often affects people who play tennis, but anyone may get the condition from repeatedly extending the wrist or turning the forearm.      What are the causes?    This condition is usually caused by repeatedly extending the wrist, turning the forearm, and using the hands. It can result from sports or work that requires repetitive forearm movements. In some cases, it may be caused by a sudden injury.      What increases the risk?    You are more likely to develop tennis elbow if you play tennis or another racket sport. You also have a higher risk if you frequently use your hands for work. Besides people who play tennis, others at greater risk include:   People who use computers.     Construction workers.     People who work in factories.     Musicians.     Cooks.     Cashiers.        What are the signs or symptoms?    Symptoms of this condition include:   Pain and tenderness in the forearm and the outer part of the elbow. Pain may be felt only when using the arm, or it may be there all the time.     A burning feeling that starts in the elbow and spreads down the forearm.     A weak grip in the hand.        How is this diagnosed?    This condition is diagnosed based on your symptoms, your medical history, and a physical exam.    You may also have X-rays or an MRI to:   Confirm the diagnosis.     Look for other issues.     Check for tears in the ligaments, muscles, or tendons.        How is this treated?     Resting and icing your arm is often the first treatment. Your health care provider may also recommend:   Medicines to reduce pain and inflammation. These may be in the form of a pill, topical gels, or shots of a steroid medicine (cortisone).     An elbow strap to reduce stress on the area.     Physical therapy. This may include massage or exercises or both.     An elbow brace to restrict the movements that cause symptoms.      If these treatments do not help relieve your symptoms, your health care provider may recommend surgery to remove damaged muscle and reattach healthy muscle to bone.      Follow these instructions at home:    If you have a brace or strap:     Wear the brace or strap as told by your health care provider. Remove it only as told by your health care provider.     Check the skin around the brace or strap every day. Tell your health care provider about any   concerns.     Loosen the brace if your fingers tingle, become numb, or turn cold and blue.     Keep the brace clean.     If the brace or strap is not waterproof:  ? Do not let it get wet.    ? Cover it with a watertight covering when you take a bath or a shower.        Managing pain, stiffness, and swelling       If directed, put ice on the injured area. To do this:  ? If you have a removable brace or strap, remove it as told by your health care provider.    ? Put ice in a plastic bag.    ? Place a towel between your skin and the bag.    ? Leave the ice on for 20 minutes, 2?3 times a day.    ? Remove the ice if your skin turns bright red. This is very important. If you cannot feel pain, heat, or cold, you have a greater risk of damage to the area.       Move your fingers often to reduce stiffness and swelling.      Activity     Rest your elbow and wrist and avoid activities that cause symptoms as told by your health care provider.     Do physical therapy exercises as told by your health care provider.     If you lift an object, lift it with your  palm facing up. This reduces stress on your elbow.      Lifestyle     If your tennis elbow is caused by sports, check your equipment and make sure that:  ? You use it correctly.    ? It is good match for you.       If your tennis elbow is caused by work or computer use, take frequent breaks to stretch your arm. Talk with your employer about ways to manage your condition at work.      General instructions     Take over-the-counter and prescription medicines only as told by your health care provider.     Do not use any products that contain nicotine or tobacco. These products include cigarettes, chewing tobacco, and vaping devices, such as e-cigarettes. If you need help quitting, ask your health care provider.     Keep all follow-up visits. This is important.        How is this prevented?     Before and after activity:  ? Warm up and stretch before being active.    ? Cool down and stretch after being active.    ? Give your body time to rest between periods of activity.       During activity:  ? Make sure to use equipment that fits you.    ? If you play tennis, put power in your stroke with your lower body. Avoid using your arm only.       Maintain physical fitness, including:  ? Strength.    ? Flexibility.    ? Endurance.       Do exercises to strengthen the forearm muscles.      Contact a health care provider if:     You have pain that gets worse or does not get better with treatment.     You have numbness or weakness in your forearm, hand, or fingers.      Get help right away if:       Your pain is severe.     You cannot move your wrist.      Summary     Tennis elbow (lateral epicondylitis) is inflammation of tendons in your outer forearm, near your elbow.     Common symptoms include pain and tenderness in your forearm and the outer part of your elbow.     This condition is usually caused by repeatedly extending your wrist, turning your forearm, and using your hands.     The first treatment is often resting and icing  your arm to relieve symptoms. Further treatment may include taking medicine, getting physical therapy, wearing a brace or strap, or having surgery.      This information is not intended to replace advice given to you by your health care provider. Make sure you discuss any questions you have with your health care provider.      Document Revised: 08/14/2019 Document Reviewed: 08/14/2019  Elsevier Patient Education ? 2021 Elsevier Inc.

## 2020-09-17 LAB — COVID-19 & INFLUENZA COMBO
INFLUENZA A: NOT DETECTED
INFLUENZA B: NOT DETECTED
SARS-CoV-2: NOT DETECTED

## 2020-09-17 NOTE — ED Notes (Signed)
ED Patient Education Note     Patient Education Materials Follows:  Emergency Medicine     Medical Screening Exam      A medical screening exam helps determine whether or not you need immediate medical treatment. This type of exam may be done in the emergency department, an urgent care setting, or your health care provider's office.    During the exam, a health care provider does a short physical exam and asks about your medical history to assess:   Your current symptoms.     Your overall health.      Depending on your symptoms, you may need additional tests.      What are the possible outcomes of a medical screening exam?    Your medical screening exam may determine that:   You do not need emergency treatment at this time.     You need treatment right away.     You need to be transferred to another medical center.     You need to have more tests.      A medical specialist may be consulted if necessary.      When should I seek medical care?    If you have a regular health care provider, make an appointment for a follow-up visit with him or her. If you do not have a regular health care provider, ask about resources in your community.      Get help right away if:     Your condition gets worse or you develop new or troubling symptoms before you see your health care provider. If this occurs, go to an emergency department right away.    In an emergency:     Call 911 or have someone drive you to the nearest hospital.     Do not drive yourself.        Summary     A medical screening exam helps determine whether or not you need immediate medical treatment.     During the exam, a health care provider does a short physical exam and asks about your current symptoms and overall health.     More tests may be ordered during the exam.     You may need to be transferred to another medical center.      This information is not intended to replace advice given to you by your health care provider. Make sure you discuss any questions  you have with your health care provider.      Document Revised: 05/26/2018 Document Reviewed: 08/30/2017  Elsevier Patient Education ? 2021 Elsevier Inc.

## 2020-09-17 NOTE — Discharge Summary (Signed)
ED Clinical Summary                     [facility]  [address]  [place], [place], [address]  [phone]          PERSON INFORMATION  Name: Elizabeth Bruce, Elizabeth Bruce Age:  50 Years DOB: [birth year]   Sex: Female Language: English PCP: EFFIONG,  CHARLES-MD   Marital Status: Single Phone: [phone] Med Service: MED-Medicine   MRN: [id number] Acct# [id number] Arrival: 09/17/2020 11:17:00   Visit Reason: Diagnostic Test; COVID TEST Acuity: 4 LOS: 000 01:28   Address:    [address] [place] [address]   Diagnosis:    Lab test negative for COVID-19 virus; URI with cough and congestion  Medications:          Medications that have not changed  Other Medications  diclofenac topical (diclofenac 1% topical gel) 1 Application Topical (on the skin) 4 times a day. Refills: 0.  Last Dose:____________________  lisinopril (lisinopril 20 mg oral tablet) 1 Tabs Oral (given by mouth) every day.  Last Dose:____________________      Medications Administered During Visit:              Allergies      No Known Medication Allergies      Major Tests and Procedures:  The following procedures and tests were performed during your ED visit.  COMMON PROCEDURES%>  COMMON PROCEDURES COMMENTS%>                PROVIDER INFORMATION               Provider Role Assigned Debby Freiberg, RN, Soyla Dryer ED Nurse 09/17/2020 11:34:54    Levy Pupa CLIVE-MD ED Provider 09/17/2020 11:38:15        Attending Physician:  Levy Pupa CLIVE-MD      Admit Doc  GREAVES,  ROBERT CLIVE-MD     Consulting Doc       VITALS INFORMATION  Vital Sign Triage Latest   Temp Oral ORAL_1%> ORAL%>   Temp Temporal TEMPORAL_1%> TEMPORAL%>   Temp Intravascular INTRAVASCULAR_1%> INTRAVASCULAR%>   Temp Axillary AXILLARY_1%> AXILLARY%>   Temp Rectal RECTAL_1%> RECTAL%>   02 Sat 99 % 99 %   Respiratory Rate RATE_1%> RATE%>   Peripheral Pulse Rate PULSE RATE_1%> PULSE RATE%>   Apical Heart Rate HEART RATE_1%> HEART RATE%>   Blood Pressure BLOOD  PRESSURE_1%>/ BLOOD PRESSURE_1%>87 mmHg BLOOD PRESSURE%> / BLOOD PRESSURE%>87 mmHg                 Immunizations      No Immunizations Documented This Visit          DISCHARGE INFORMATION   Discharge Disposition: H Outpt-Sent Home   Discharge Location:  Home   Discharge Date and Time:  09/17/2020 12:45:25   ED Checkout Date and Time:  09/17/2020 12:45:25     DEPART REASON INCOMPLETE INFORMATION               Depart Action Incomplete Reason   Interactive View/I&O Recently assessed               Problems      Active           Chronic hypertension              Smoking Status      Never smoker         PATIENT EDUCATION INFORMATION  Instructions:     Medical Screening Exam     Follow up:                   With: Address: When:   Follow up with primary care provider  Within 1 to 2 weeks, only if needed   Comments:       Return to ED if symptoms worsen. Call [phone] to establish a Primary Care Doctor. You may also use the online tool at GumSearch[employer]nl              ED PROVIDER DOCUMENTATION     Patient:   Elizabeth Bruce            MRN: [id number]            FIN: [id number]               Age:   63 years     Sex:  Female     DOB:  [birth year]   Associated Diagnoses:   Lab test negative for COVID-19 virus; URI with cough and congestion   Author:   Levy Pupa CLIVE-MD      Basic Information   Time seen: Provider Seen (ST)   ED Provider/Time:    GREAVES,  ROBERT CLIVE-MD / 09/17/2020 11:38  .   Additional information: Chief Complaint from Nursing Triage Note   Chief Complaint  Chief Complaint: pt requesting covid test, states she was around someone on 7/25 that had covid and since then has not had any energy, and no appetite. (09/17/20 11:22:00).      History of Present Illness   The patient presents with Wants a COVID test.  Had a exposure 5 to 6 days ago.  Minimal symptoms.  Took a home COVID test yesterday which was negative.  Told by others that they have had similar issues with home test negative (go  somewhere else and had a positive test. and Prior history of COVID years ago.  COVID vaccinated.  Normal vital signs.  Risk factors consist of not coronary artery disease, not hypertension, not diabetes mellitus, not recent surgery and not pregnancy.        Review of Systems   Constitutional symptoms:  No fever,    ENMT symptoms:  Sore throat, nasal congestion.    Respiratory symptoms:  No shortness of breath,    Cardiovascular symptoms:  No chest pain,    Gastrointestinal symptoms:  No abdominal pain,              Additional review of systems information: All other systems reviewed and otherwise negative.      Health Status   Allergies:    Allergic Reactions (Selected)  No Known Medication Allergies.      Past Medical/ Family/ Social History   Surgical history: Reviewed as documented in chart.   Family history: Reviewed as documented in chart.   Social history: Reviewed as documented in chart.   Problem list:    Active Problems (1)  Chronic hypertension   , per nurse's notes.      Physical Examination               Vital Signs   Vital Signs   09/17/2020 11:22 EDT Systolic Blood Pressure 131 mmHg    Diastolic Blood Pressure 87 mmHg    Temperature Oral 37.0 degC    Heart Rate Monitored 88 bpm    Respiratory Rate 16 br/min    SpO2 99 %   .  Measurements   09/17/2020 11:24 EDT Body Mass Index est meas 29.74 kg/m2   09/17/2020 11:24 EDT Body Mass Index Measured 29.74 kg/m2   09/17/2020 11:22 EDT Height/Length Measured 173 cm    Weight Dosing 89 kg   .   Basic Oxygen Information   09/17/2020 11:22 EDT Oxygen Therapy Room air    SpO2 99 %   .   General:  Alert, no acute distress.    Neck:  Supple, no JVD.    Respiratory:  Respirations are non-labored.   Psychiatric:  Cooperative, appropriate mood & affect, normal judgment.       Medical Decision Making   Documents reviewed:  Emergency department nurses' notes.   Results review:  Lab results : Lab View   09/17/2020 11:25 EDT COVID (SARS-CoV-2) (LIAT) Not Detected    Influenza A  (LIAT) Not Detected    Influenza B (LIAT) Not Detected   09/17/2020 11:24 EDT Estimated Creatinine Clearance 89.41 mL/min   .      Reexamination/ Reevaluation   Vital signs   Basic Oxygen Information   09/17/2020 11:22 EDT Oxygen Therapy Room air    SpO2 99 %      Notes: MEDICAL DECISION MAKING.   DISCUSSION & PLAN    Exposure about 5 days ago.  COVID screening test negative.  Minimal URI symptoms.      Impression and Plan   Diagnosis   Lab test negative for COVID-19 virus (ICD10-CM Z20.822, Discharge, Medical)   URI with cough and congestion (ICD10-CM J06.9, Discharge, Medical)   Plan   Condition: Stable.    Disposition: Discharged: to home.    Patient was given the following educational materials: Medical Screening Exam.    Follow up with: Follow up with primary care provider Within 1 to 2 weeks, only if needed     Return to ED if symptoms worsen.  Call [phone] to establish a Primary Care Doctor.  You may also use the online tool at [employer].    Counseled: Patient, Regarding diagnosis, Regarding diagnostic results, Regarding treatment plan, Patient indicated understanding of instructions.

## 2020-09-17 NOTE — ED Notes (Signed)
 ED Patient Summary       ;       [facility] Emergency Department  [address] [place] [address]  [phone]  Discharge Instructions (Patient)  Name: Elizabeth Bruce, Elizabeth Bruce  DOB: 01/12/1971                   MRN: [id number]                   FIN: [phone]  Reason For Visit: Diagnostic Test; COVID TEST  Final Diagnosis: Lab test negative for COVID-19 virus; URI with cough and congestion     Visit Date: 09/17/2020 11:17:00  Address: [address] [place] [address]  Phone: [phone]     Emergency Department Providers:         Primary Physician:      REUBEN LAMAR ANTHONY [facility] Lionel would like to thank you for allowing us  to assist you with your healthcare needs. The following includes patient education materials and information regarding your injury/illness.     Follow-up Instructions:  You were seen today on an emergency basis. Please contact your primary care doctor for a follow up appointment. If you received a referral to a specialist doctor, it is important you follow-up as instructed.    It is important that you call your follow-up doctor to schedule and confirm the location of your next appointment. Your doctor may practice at multiple locations. The office location of your follow-up appointment may be different to the one written on your discharge instructions.    If you do not have a primary care doctor, please call [phone] for help in finding a [facility] Provider. For help in finding a specialist doctor, please call [phone].    If your condition gets worse before your follow-up with your primary care doctor or specialist, please return to the Emergency Department.      Coronavirus 2019 (COVID-19) Reminders:     Patients age 42 - 6, with parental consent, and patients over age 64 can make an appointment for a COVID-19 vaccine. Patients can contact their [facility] doctors' offices to schedule an  appointment to receive the COVID-19 vaccine. Patients who do not have a [facility] physician can call [phone] to schedule vaccination appointments.      Follow Up Appointments:  Primary Care Provider:     Name: Elizabeth Bruce     Phone: [phone]                 With: Address: When:   Follow up with primary care provider  Within 1 to 2 weeks, only if needed   Comments:       Return to ED if symptoms worsen. Call [phone] to establish a Primary Care Doctor. You may also use the online tool at GumSearch.nl              Post [facility] SERVICES%>          Medications that have not changed  Other Medications  diclofenac topical (diclofenac 1% topical gel) 1 Application Topical (on the skin) 4 times a day. Refills: 0.  Last Dose:____________________  lisinopril (lisinopril 20 mg oral tablet) 1 Tabs Oral (given by mouth) every day.  Last Dose:____________________      Allergy Info: No Known Medication Allergies     Discharge Additional Information  Discharge Patient 09/17/20 12:40:00 EDT      Patient Education Materials:        Medical Screening Exam      A medical screening exam helps determine whether or not you need immediate medical treatment. This type of exam may be done in the emergency department, an urgent care setting, or your health care provider's office.    During the exam, a health care provider does a short physical exam and asks about your medical history to assess:   Your current symptoms.     Your overall health.      Depending on your symptoms, you may need additional tests.      What are the possible outcomes of a medical screening exam?    Your medical screening exam may determine that:   You do not need emergency treatment at this time.     You need treatment right away.     You need to be transferred to another medical center.     You need to have more tests.      A medical specialist may be consulted if necessary.      When should I  seek medical care?    If you have a regular health care provider, make an appointment for a follow-up visit with him or her. If you do not have a regular health care provider, ask about resources in your community.      Get help right away if:     Your condition gets worse or you develop new or troubling symptoms before you see your health care provider. If this occurs, go to an emergency department right away.    In an emergency:     Call 911 or have someone drive you to the nearest hospital.     Do not drive yourself.        Summary     A medical screening exam helps determine whether or not you need immediate medical treatment.     During the exam, a health care provider does a short physical exam and asks about your current symptoms and overall health.     More tests may be ordered during the exam.     You may need to be transferred to another medical center.      This information is not intended to replace advice given to you by your health care provider. Make sure you discuss any questions you have with your health care provider.      Document Revised: 05/26/2018 Document Reviewed: 08/30/2017  Elsevier Patient Education ? 2021 Elsevier Inc.      ---------------------------------------------------------------------------------------------------------------------  [facility] allows patients to review your COVID and other test results as well as discharge documents from any [facility], Emergency Department, surgical center or outpatient lab. Test results are typically available 36 hours after the test is completed.     [facility] encourages you to self-enroll in the [facility] Patient Portal.     To begin your self-enrollment process, please visit [email or web address] Under [facility], click on "Sign up now".     NOTE: You must be 16 years and older to use [facility] Self-Enroll online. If you are a parent, caregiver, or guardian; you  need an invite to access your child's or dependent's health records. To obtain an invite, contact the Medical Records department at [phone] Monday through Friday, 8-4:30, select option 3 . If we receive your call afterhours, we will return your  call the next business day.     If you have issues trying to create or access your account, contact [employer] support at [phone] available 7 days a week 24 hours a day.     Comment:

## 2020-09-17 NOTE — ED Provider Notes (Signed)
General Medical Problem *ED        Patient:   Elizabeth Bruce, Elizabeth Bruce            MRN: [id number]            FIN: [id number]               Age:   50 years     Sex:  Female     DOB:  11/07/1970   Associated Diagnoses:   Lab test negative for COVID-19 virus; URI with cough and congestion   Author:   Levy Pupa CLIVE-MD      Basic Information   Time seen: Provider Seen (ST)   ED Provider/Time:    Kinsler Soeder,  Elizabelle Fite CLIVE-MD / 09/17/2020 11:38  .   Additional information: Chief Complaint from Nursing Triage Note   Chief Complaint  Chief Complaint: pt requesting covid test, states she was around someone on 7/25 that had covid and since then has not had any energy, and no appetite. (09/17/20 11:22:00).      History of Present Illness   The patient presents with Wants a COVID test.  Had a exposure 5 to 6 days ago.  Minimal symptoms.  Took a home COVID test yesterday which was negative.  Told by others that they have had similar issues with home test negative (go somewhere else and had a positive test. and Prior history of COVID years ago.  COVID vaccinated.  Normal vital signs.  Risk factors consist of not coronary artery disease, not hypertension, not diabetes mellitus, not recent surgery and not pregnancy.        Review of Systems   Constitutional symptoms:  No fever,    ENMT symptoms:  Sore throat, nasal congestion.    Respiratory symptoms:  No shortness of breath,    Cardiovascular symptoms:  No chest pain,    Gastrointestinal symptoms:  No abdominal pain,              Additional review of systems information: All other systems reviewed and otherwise negative.      Health Status   Allergies:    Allergic Reactions (Selected)  No Known Medication Allergies.      Past Medical/ Family/ Social History   Surgical history: Reviewed as documented in chart.   Family history: Reviewed as documented in chart.   Social history: Reviewed as documented in chart.   Problem list:    Active Problems (1)  Chronic hypertension   , per nurse's  notes.      Physical Examination               Vital Signs   Vital Signs   09/17/2020 11:22 EDT Systolic Blood Pressure 131 mmHg    Diastolic Blood Pressure 87 mmHg    Temperature Oral 37.0 degC    Heart Rate Monitored 88 bpm    Respiratory Rate 16 br/min    SpO2 99 %   .   Measurements   09/17/2020 11:24 EDT Body Mass Index est meas 29.74 kg/m2   09/17/2020 11:24 EDT Body Mass Index Measured 29.74 kg/m2   09/17/2020 11:22 EDT Height/Length Measured 173 cm    Weight Dosing 89 kg   .   Basic Oxygen Information   09/17/2020 11:22 EDT Oxygen Therapy Room air    SpO2 99 %   .   General:  Alert, no acute distress.    Neck:  Supple, no JVD.    Respiratory:  Respirations are non-labored.  Psychiatric:  Cooperative, appropriate mood & affect, normal judgment.       Medical Decision Making   Documents reviewed:  Emergency department nurses' notes.   Results review:  Lab results : Lab View   09/17/2020 11:25 EDT COVID (SARS-CoV-2) (LIAT) Not Detected    Influenza A (LIAT) Not Detected    Influenza B (LIAT) Not Detected   09/17/2020 11:24 EDT Estimated Creatinine Clearance 89.41 mL/min   .      Reexamination/ Reevaluation   Vital signs   Basic Oxygen Information   09/17/2020 11:22 EDT Oxygen Therapy Room air    SpO2 99 %      Notes: MEDICAL DECISION MAKING.   DISCUSSION & PLAN    Exposure about 5 days ago.  COVID screening test negative.  Minimal URI symptoms.      Impression and Plan   Diagnosis   Lab test negative for COVID-19 virus (ICD10-CM Z20.822, Discharge, Medical)   URI with cough and congestion (ICD10-CM J06.9, Discharge, Medical)   Plan   Condition: Stable.    Disposition: Discharged: to home.    Patient was given the following educational materials: Medical Screening Exam.    Follow up with: Follow up with primary care provider Within 1 to 2 weeks, only if needed     Return to ED if symptoms worsen.  Call [phone] to establish a Primary Care Doctor.  You may also use the online tool at GumSearch.nl.    Counseled:  Patient, Regarding diagnosis, Regarding diagnostic results, Regarding treatment plan, Patient indicated understanding of instructions.    Signature Line     Electronically Signed on 09/17/2020 12:44 PM EDT   ________________________________________________   Levy Pupa CLIVE-MD               Modified by: Levy Pupa CLIVE-MD on 09/17/2020 12:44 PM EDT

## 2020-09-17 NOTE — ED Notes (Signed)
ED Triage Note       ED Secondary Triage Entered On:  09/17/2020 11:24 EDT    Performed On:  09/17/2020 11:24 EDT by Zadie Rhine, RN, KRISTY M               General Information   Barriers to Learning :   None evident   ED Home Meds Section :   Document assessment   [facility] ED Fall Risk Section :   Document assessment   ED Advance Directives Section :   Document assessment   ED Palliative Screen :   N/A (prefilled for <48yo)   BELLEW, RN, KRISTY M - 09/17/2020 11:24 EDT   (As Of: 09/17/2020 11:24:54 EDT)   Problems(Active)    Chronic hypertension (IMO  :[id number] )  Name of Problem:   Chronic hypertension ; Recorder:   Fritz Pickerel; Confirmation:   Confirmed ; Classification:   Medical ; Code:   [id number] ; Contributor System:   Dietitian ; Last Updated:   04/04/2015 13:34 EST ; Life Cycle Date:   04/04/2015 ; Life Cycle Status:   Active ; Vocabulary:   IMO          Diagnoses(Active)    Diagnostic Test  Date:   09/17/2020 ; Diagnosis Type:   Reason For Visit ; Confirmation:   Complaint of ; Clinical Dx:   Diagnostic Test ; Classification:   Medical ; Clinical Service:   Emergency medicine ; Code:   PNED ; Probability:   0 ; Diagnosis Code:   [id number]             -    Procedure History   (As Of: 09/17/2020 11:24:54 EDT)     Anesthesia Minutes:   0 ; Procedure Name:   Bilateral tubal ligation ; Procedure Minutes:   0            [facility] Fall Risk Assessment Tool   Hx of falling last 3 months ED Fall :   No   Patient confused or disoriented ED Fall :   No   Patient intoxicated or sedated ED Fall :   No   Patient impaired gait ED Fall :   No   Use a mobility assistance device ED Fall :   No   Patient altered elimination ED Fall :   No   [facility] ED Fall Score :   0    BELLEW, RN, Soyla Dryer - 09/17/2020 11:24 EDT   ED Advance Directive   Advance Directive :   No   BELLEW, RN, Soyla Dryer - 09/17/2020 11:24 EDT
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
## 2020-09-24 ENCOUNTER — Encounter: Attending: Student in an Organized Health Care Education/Training Program

## 2021-02-23 ENCOUNTER — Inpatient Hospital Stay: Admit: 2021-02-23 | Discharge: 2021-02-23 | Disposition: A | Discharge status: Home / Self Care

## 2021-02-23 DIAGNOSIS — S161XXA Strain of muscle, fascia and tendon at neck level, initial encounter: Secondary | ICD-10-CM

## 2021-02-23 MED ORDER — CYCLOBENZAPRINE HCL 5 MG PO TABS
5 MG | ORAL_TABLET | Freq: Two times a day (BID) | ORAL | 0 refills | Status: AC | PRN
Start: 2021-02-23 — End: 2021-03-05

## 2021-02-23 MED ORDER — KETOROLAC TROMETHAMINE 15 MG/ML IJ SOLN
15 MG/ML | Freq: Once | INTRAMUSCULAR | Status: DC
Start: 2021-02-23 — End: 2021-02-23
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
## 2022-01-04 ENCOUNTER — Inpatient Hospital Stay
Admit: 2022-01-04 | Discharge: 2022-01-04 | Disposition: A | Discharge status: Home / Self Care | Payer: PRIVATE HEALTH INSURANCE

## 2022-01-04 ENCOUNTER — Inpatient Hospital Stay: Admit: 2022-01-04 | Payer: PRIVATE HEALTH INSURANCE

## 2022-01-04 DIAGNOSIS — I159 Secondary hypertension, unspecified: Secondary | ICD-10-CM

## 2022-01-04 LAB — CBC WITH AUTO DIFFERENTIAL
Absolute Baso #: 0 10*3/uL (ref 0.0–0.2)
Absolute Eos #: 0.2 10*3/uL (ref 0.0–0.5)
Absolute Lymph #: 1.8 10*3/uL (ref 1.0–3.2)
Absolute Mono #: 0.4 10*3/uL (ref 0.3–1.0)
Basophils %: 0.6 % (ref 0.0–2.0)
Eosinophils %: 4.2 % (ref 0.0–7.0)
Hematocrit: 35.4 % (ref 34.0–47.0)
Hemoglobin: 11.3 g/dL — ABNORMAL LOW (ref 11.5–15.7)
Immature Grans (Abs): 0.02 10*3/uL (ref 0.00–0.06)
Immature Granulocytes: 0.4 % (ref 0.0–0.6)
Lymphocytes: 34.6 % (ref 15.0–45.0)
MCH: 26.7 pg — ABNORMAL LOW (ref 27.0–34.5)
MCHC: 31.9 g/dL — ABNORMAL LOW (ref 32.0–36.0)
MCV: 83.5 fL (ref 81.0–99.0)
MPV: 10.7 fL (ref 7.2–13.2)
Monocytes: 7.3 % (ref 4.0–12.0)
Neutrophils %: 52.9 % (ref 42.0–74.0)
Neutrophils Absolute: 2.8 10*3/uL (ref 1.6–7.3)
Platelets: 306 10*3/uL (ref 140–440)
RBC: 4.24 x10e6/mcL (ref 3.60–5.20)
RDW: 16 % (ref 11.0–16.0)
WBC: 5.2 10*3/uL (ref 3.8–10.6)

## 2022-01-04 LAB — BASIC METABOLIC PANEL
Anion Gap: 10 mmol/L (ref 2–17)
BUN: 10 mg/dL (ref 6–20)
CO2: 24 mmol/L (ref 22–29)
Calcium: 9.1 mg/dL (ref 8.6–10.0)
Chloride: 100 mmol/L (ref 98–107)
Creatinine: 0.8 mg/dL (ref 0.5–1.0)
Est, Glom Filt Rate: 90 mL/min/1.73m (ref >=60)
Glucose: 98 mg/dL (ref 70–99)
OSMOLALITY CALCULATED: 267 mOsm/kg — ABNORMAL LOW (ref 270–287)
Potassium: 4 mmol/L (ref 3.5–5.3)
Sodium: 134 mmol/L — ABNORMAL LOW (ref 135–145)

## 2022-01-04 MED ORDER — HYDROCHLOROTHIAZIDE 12.5 MG PO CAPS
12.5 MG | ORAL_CAPSULE | Freq: Every morning | ORAL | 0 refills | Status: AC
Start: 2022-01-04 — End: 2022-02-03
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
## 2022-01-04 NOTE — Discharge Instructions (Signed)
Please continue to take daily lisinopril and add on hydrochlorothiazide as it seemed to have worked for you in the past.  The results of your work-up are very reassuring today.  Please call to schedule follow-up with your primary care provider for management and titration of medication as needed.  For any acute worsening or changing of symptoms the patient should return to ED for further work-up and evaluation

## 2022-01-07 LAB — EKG 12-LEAD
P Axis: 76 degrees
P-R Interval: 192 ms
Q-T Interval: 422 ms
QRS Duration: 86 ms
QTc Calculation (Bazett): 434 ms
R Axis: 58 degrees
T Axis: 64 degrees
Ventricular Rate: 65 {beats}/min

## 2022-04-09 ENCOUNTER — Inpatient Hospital Stay
Admit: 2022-04-09 | Discharge: 2022-04-09 | Disposition: A | Discharge status: Home / Self Care | Payer: PRIVATE HEALTH INSURANCE

## 2022-04-09 DIAGNOSIS — N760 Acute vaginitis: Secondary | ICD-10-CM

## 2022-04-09 LAB — URINALYSIS W/ RFLX MICROSCOPIC
Bilirubin Urine: NEGATIVE
Glucose, UA: NEGATIVE
Ketones, Urine: NEGATIVE
Leukocyte Esterase, Urine: NEGATIVE
Nitrite, Urine: NEGATIVE
Protein, UA: NEGATIVE
Specific Gravity, UA: >=1.03 — AB (ref 1.003–1.035)
Urobilinogen, Urine: 1 EU/dL
pH, UA: 6 (ref 4.5–8.0)

## 2022-04-09 LAB — WET PREP, GENITAL
CLUE CELLS WP, TEST92: ABSENT
RBC, Wet Prep: ABSENT
Trich, Wet Prep: ABSENT
WBC, Wet Prep: ABSENT

## 2022-04-09 LAB — SMEAR FUNGUS: FINAL REPORT: NONE SEEN

## 2022-04-09 MED ORDER — AZITHROMYCIN 250 MG PO TABS
250 | Freq: Once | ORAL | Status: AC
Start: 2022-04-09 — End: 2022-04-09
  Administered 2022-04-09: 19:00:00 1000 mg via ORAL

## 2022-04-09 MED ORDER — CEFTRIAXONE SODIUM 500 MG IJ SOLR
500 | Freq: Once | INTRAMUSCULAR | Status: AC
Start: 2022-04-09 — End: 2022-04-09
  Administered 2022-04-09: 19:00:00 500 mg via INTRAMUSCULAR

## 2022-04-09 MED ORDER — FLUCONAZOLE 150 MG PO TABS
150 | ORAL_TABLET | ORAL | 0 refills | Status: AC
Start: 2022-04-09 — End: 2022-04-13

## 2022-04-09 MED FILL — CEFTRIAXONE SODIUM 500 MG IJ SOLR: 500 MG | INTRAMUSCULAR | Qty: 500

## 2022-04-09 MED FILL — AZITHROMYCIN 250 MG PO TABS: 250 MG | ORAL | Qty: 4

## 2022-04-09 NOTE — ED Provider Notes (Signed)
[facility] EMERGENCY DEPT  EMERGENCY DEPARTMENT ENCOUNTER      Pt Name: Elizabeth Bruce  MRN: [id number]  [place] 1970-05-28  Date of evaluation: 04/09/2022  Provider: Johney Frame, PA-C    CHIEF COMPLAINT       Chief Complaint   Patient presents with    Vaginal Itching     Pt reports vaginal itching and burning. Just finished flagly three days ago. Denis pain or discomfort with urination.          HISTORY OF PRESENT ILLNESS        Patient is a 52 year old female with history of HTN who presents to the emergency department for evaluation of vaginal irritation.  Patient states that she was evaluated 1 week ago at outside facility and tested positive for trichomonas.  She was placed on Flagyl.  She finished this medication 3 days ago.  Since this time she has developed vaginal itching and irritation.  She tried over-the-counter Monistat for suspected yeast infection however continues to have symptoms prompting arrival here today.  Patient denies new sexual partners or associated vaginal bleeding or discharge.  No known direct exposure to STIs.  No urinary symptoms or changes in bowel function.  No associate abdominal pain, nausea, vomiting, fevers, chills, or any other symptoms at this time.  Patient is status post tubal ligation.  No other current complaints.  Patient is uncertain if she was tested for gonorrhea and chlamydia at previous visit.    The history is provided by the patient.       Nursing Notes were reviewed.    REVIEW OF SYSTEMS         Review of Systems   Constitutional:  Negative for chills, fatigue and fever.   HENT:  Negative for congestion, postnasal drip and rhinorrhea.    Eyes:  Negative for visual disturbance.   Respiratory:  Negative for cough, shortness of breath, wheezing and stridor.    Cardiovascular:  Negative for chest pain.   Gastrointestinal:  Negative for abdominal pain, diarrhea, nausea and vomiting.   Genitourinary:  Positive for vaginal pain. Negative for dysuria, hematuria,  urgency, vaginal bleeding and vaginal discharge.   Musculoskeletal:  Negative for arthralgias, myalgias, neck pain and neck stiffness.   Skin:  Negative for color change, rash and wound.   Neurological:  Negative for dizziness and headaches.       Except as noted above the remainder of the review of systems was reviewed and negative.       PAST MEDICAL HISTORY     Past Medical History:   Diagnosis Date    High blood pressure          SURGICAL HISTORY       Past Surgical History:   Procedure Laterality Date    TUBAL LIGATION           CURRENT MEDICATIONS       Previous Medications    HYDROCHLOROTHIAZIDE (MICROZIDE) 12.5 MG CAPSULE    Take 1 capsule by mouth every morning    IBUPROFEN (ADVIL;MOTRIN) 200 MG TABLET    1 tablet with food or milk as needed Orally Three times a day    LISINOPRIL (PRINIVIL;ZESTRIL) 40 MG TABLET    1 tablet Orally Once a day for 30 day(s)       ALLERGIES     Patient has no known allergies.    FAMILY HISTORY       Family History   Problem Relation Age of Onset  Diabetes Mother     Hypertension Mother     Gout Father           SOCIAL HISTORY       Social History     Socioeconomic History    Marital status: Single   Tobacco Use    Smoking status: Never    Smokeless tobacco: Never   Substance and Sexual Activity    Alcohol use: Yes     Comment: 2-3 times a week. 3 points. pos interpretation.       SCREENINGS         Glasgow Coma Scale  Eye Opening: Spontaneous  Best Verbal Response: Oriented  Best Motor Response: Obeys commands  Glasgow Coma Scale Score: 15                     CIWA Assessment  BP: 136/82  Pulse: (!) 101                 PHYSICAL EXAM         ED Triage Vitals   BP Temp Temp Source Pulse Respirations SpO2 Height Weight - Scale   04/09/22 1156 04/09/22 1156 04/09/22 1156 04/09/22 1156 04/09/22 1156 04/09/22 1156 -- 04/09/22 1155   136/82 98.5 F (36.9 C) Oral (!) 101 16 99 %  96.6 kg (213 lb)       Physical Exam  Vitals and nursing note reviewed. Exam conducted with a chaperone  present.   Constitutional:       General: She is not in acute distress.     Appearance: Normal appearance. She is not ill-appearing.   HENT:      Head: Normocephalic and atraumatic.      Nose: Nose normal.      Mouth/Throat:      Mouth: Mucous membranes are moist.   Eyes:      General: No scleral icterus.     Extraocular Movements: Extraocular movements intact.      Pupils: Pupils are equal, round, and reactive to light.   Cardiovascular:      Rate and Rhythm: Normal rate and regular rhythm.      Pulses: Normal pulses.      Heart sounds: No murmur heard.  Pulmonary:      Effort: Pulmonary effort is normal. No respiratory distress.      Breath sounds: Normal breath sounds. No stridor. No wheezing, rhonchi or rales.   Abdominal:      General: Abdomen is flat.      Palpations: Abdomen is soft.      Tenderness: There is no abdominal tenderness. There is no right CVA tenderness, left CVA tenderness, guarding or rebound.   Genitourinary:     Comments: Pelvic exam is completed with patient's verbal consent and nurse chaperone.  No external cutaneous abnormalities consistent with Fournier gangrene or cellulitis.  White clumpy vaginal discharge present in the vaginal vault.  Speculum exam without mass or bleeding.  No discoloration to cervix.  Swabs obtained.  Negative bimanual exam for cervical motion tenderness or tenderness overlying either adnexa.  No suprapubic tenderness.  Will defer rectal exam.  Musculoskeletal:         General: No swelling, tenderness, deformity or signs of injury. Normal range of motion.      Cervical back: Normal range of motion and neck supple. No rigidity or tenderness.      Right lower leg: No edema.      Left lower leg: No edema.  Skin:     General: Skin is warm and dry.      Capillary Refill: Capillary refill takes less than 2 seconds.      Findings: No erythema or rash.   Neurological:      Mental Status: She is alert and oriented to person, place, and time.         DIAGNOSTIC RESULTS      EKG: All EKG's are interpreted by the Emergency Department Physician who either signs or Co-signs this chart in the absence of a cardiologist.      RADIOLOGY:   Non-plain film images such as CT, Ultrasound and MRI are read by the radiologist. Plain radiographic images are visualized and preliminarily interpreted by the emergency physician with the below findings:      Interpretation per the Radiologist below, if available at the time of this note:    No orders to display         LABS:  Labs Reviewed   WET PREP, GENITAL - Abnormal; Notable for the following components:       Result Value    Epi Cells Present (*)     All other components within normal limits   URINALYSIS W/ RFLX MICROSCOPIC - Abnormal; Notable for the following components:    Specific Gravity, UA >=1.030 (*)     All other components within normal limits   SMEAR FUNGUS   C.TRACHOMATIS N.GONORRHOEAE DNA    Narrative:     Source:->Vaginal       All other labs were within normal range or not returned as of this dictation.    EMERGENCY DEPARTMENT COURSE and DIFFERENTIAL DIAGNOSIS/MDM:   Vitals:    Vitals:    04/09/22 1155 04/09/22 1156   BP:  136/82   Pulse:  (!) 101   Resp:  16   Temp:  98.5 F (36.9 C)   TempSrc:  Oral   SpO2:  99%   Weight: 96.6 kg (213 lb)             MDM  Number of Diagnoses or Management Options  Vaginal discharge  Vaginitis and vulvovaginitis  Diagnosis management comments: Patient is afebrile stable vital signs on presentation.  Patient no acute distress.  Pelvic exam completed with nurse chaperone and patient's verbal consent.  Patient actually test negative for trichomonas, BV, or obvious candidiasis.  This differs greatly on my physical exam as she has a copious amount of white clumpy material and reports significant vaginal itching.  Will send for gonorrhea chlamydia testing as well.  She was provided Rocephin and azithromycin prior to discharge.  Will treat patient based on clinical exam with fluconazole.  Patient was  advised to have close follow-up with OB/GYN for reevaluation and continued management.  She was advised to return the emergency department immediately for any new or worsening symptoms.  Patient in agreement.          FINAL IMPRESSION      1. Vaginitis and vulvovaginitis    2. Vaginal discharge          DISPOSITION/PLAN   DISPOSITION Decision To Discharge 04/09/2022 01:38:23 PM      PATIENT REFERRED TO:  Your OB/GYN    In 1 week  For re-check      DISCHARGE MEDICATIONS:  New Prescriptions    FLUCONAZOLE (DIFLUCAN) 150 MG TABLET    Take 1 tablet by mouth every 72 hours for 2 doses  No data to display                (Please note that portions of this note were completed with a voice recognition program.  Efforts were made to edit the dictations but occasionally words are mis-transcribed.)    Johney Frame, PA-C (electronically signed)  Emergency Physician Assistant           Renee Rival, PA-C  04/09/22 1345

## 2022-04-09 NOTE — Discharge Instructions (Signed)
Begin use of fluconazole medication for treatment of yeast infection as prescribed.  Take the first pill and then 72 hours later take the second pill.  You receive results of gonorrhea chlamydia testing within the next 5 days.  You have already been treated however.  Schedule close follow-up with your OB/GYN for reevaluation and continued management.  Return to the emergency department immediately for any new or worsening symptoms.

## 2022-04-10 LAB — C.TRACHOMATIS N.GONORRHOEAE DNA
Chlamydia trachomatis, NAA: NEGATIVE
Neisseria Gonorrhoeae, NAA: NEGATIVE

## 2023-07-18 ENCOUNTER — Encounter: Payer: PRIVATE HEALTH INSURANCE | Attending: Internal Medicine

## 2023-07-18 NOTE — Progress Notes (Unsigned)
 Date:  July 18, 2023  Patient name: Elizabeth Bruce  Date of Birth: 04-03-1970    CARDIOLOGY CLINIC EVALUATION    Cardiac murmur  Cardiac clearance for DOT physical     HISTORY OF PRESENT ILLNESS               PAST MEDICAL, SOCIAL AND FAMILY HISTORY          Past Medical History:    Past Medical History:   Diagnosis Date    High blood pressure         Past Surgical History:    Past Surgical History:   Procedure Laterality Date    TUBAL LIGATION          Social History:    Social History     Tobacco Use    Smoking status: Never    Smokeless tobacco: Never   Substance Use Topics    Alcohol use: Yes     Comment: 2-3 times a week. 3 points. pos interpretation.        Family History:   Family History   Problem Relation Age of Onset    Diabetes Mother     Hypertension Mother     Gout Father           MEDICATIONS AND ALLERGIES         Current Outpatient Medications   Medication Sig Dispense Refill    hydroCHLOROthiazide  (MICROZIDE ) 12.5 MG capsule Take 1 capsule by mouth every morning 30 capsule 0    lisinopril (PRINIVIL;ZESTRIL) 40 MG tablet 1 tablet Orally Once a day for 30 day(s)      ibuprofen (ADVIL;MOTRIN) 200 MG tablet 1 tablet with food or milk as needed Orally Three times a day       No current facility-administered medications for this visit.            Allergies:    No Known Allergies       REVIEW OF SYSTEMS       Review of Systems       PHYSICAL EXAM          There were no vitals taken for this visit.     Physical Exam       CARDIAC DIAGNOSTICS          No results found for this or any previous visit (from the past 4464 hours).    No results found for this or any previous visit.      No results found for this or any previous visit.       No results found for this or any previous visit.         ASSESSMENT AND RECOMMENDATIONS      IMPRESSION:   Cardiac  murmur   Cardiac evaluation for DOT physical due to heart murmur   Hypertension    RECOMMENDATIONS:         FOLLOW UP:     Thank you for allowing me to participate in the care of your patient.    Earl Glassing, MD   [facility] / [facility]

## 2023-07-19 ENCOUNTER — Ambulatory Visit: Admit: 2023-07-19 | Discharge: 2023-07-19 | Payer: PRIVATE HEALTH INSURANCE | Attending: Internal Medicine

## 2023-07-19 VITALS — BP 118/78 | HR 81 | Resp 18 | Ht 68.0 in | Wt 207.2 lb

## 2023-07-19 DIAGNOSIS — R0602 Shortness of breath: Secondary | ICD-10-CM

## 2023-07-19 NOTE — Progress Notes (Signed)
 Date:  July 19, 2023  Patient name: Elizabeth Bruce  Date of Birth: [birth year]    CARDIOLOGY CLINIC EVALUATION    Cardiac murmur  Cardiac clearance for DOT physical     HISTORY OF PRESENT ILLNESS         This is a 53 year old woman with no cardiac history, risk factors including hypertension, hypercholesterolemia that is now well-controlled after significant weight loss here for evaluation of a heart murmur.  The patient was diagnosed with a heart murmur in the 1990s.  At the time they were contemplating additional workup but then everything to him seem to have settled down.  Since then she has had intermittently a heart murmur.  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  The patient denies any chest pains on exertion no PND orthopnea lower extremity edema.  No lightheadedness dizziness or syncope.  Occasionally gets a fluttering sensation lasting a few seconds.  Occasionally a pressure in her chest nonexertional.  No family history of premature coronary artery sudden cardiac death or congenital heart disease.      PAST MEDICAL, SOCIAL AND FAMILY HISTORY          Past Medical History:    Hypertension   Hypercholesterolemia   History of heart murmur 1990     Past Surgical History:    Past Surgical History:   Procedure Laterality Date    TUBAL LIGATION          Social History:    Social History     Tobacco Use    Smoking status: Never    Smokeless tobacco: Never   Substance Use Topics    Alcohol use: Yes     Comment: 2-3 times a week. 3 points. pos interpretation.   WORK: drive trucks   EXERCISE: not exercising   NUTRITION: better switched diet      Family History:   Family History   Problem Relation Age of Onset    Diabetes Mother     Hypertension  Mother     Gout Father       No premature CAD or SCD     MEDICATIONS AND ALLERGIES         Current Outpatient Medications   Medication Sig Dispense Refill    lisinopril-hydroCHLOROthiazide  (PRINZIDE;ZESTORETIC) 20-12.5 MG per tablet Take 1 tablet by mouth daily       No current facility-administered medications for this visit.            Allergies:    No Known Allergies       REVIEW OF SYSTEMS       Review of Systems   Constitutional:  Negative for chills, fatigue, fever and unexpected weight change.   HENT:  Negative for hearing loss, nosebleeds, sore throat, tinnitus and voice change.    Eyes:  Negative for visual disturbance.   Respiratory:  Negative for apnea, cough, chest tightness, shortness of breath and wheezing.    Cardiovascular:  Positive for palpitations. Negative for chest pain and leg swelling.   Gastrointestinal:  Negative for anal bleeding, blood in stool, constipation and diarrhea.   Endocrine: Negative for polydipsia.   Genitourinary:  Negative for difficulty urinating, dysuria, hematuria and urgency.   Musculoskeletal:  Negative for myalgias.   Skin:  Negative for rash and wound.   Neurological:  Negative for dizziness, syncope, weakness, light-headedness and numbness.   Hematological:  Does not bruise/bleed easily.   Psychiatric/Behavioral:  Negative for sleep disturbance.           PHYSICAL EXAM          BP 118/78 (BP Site: Left Upper Arm, Patient Position: Sitting)   Pulse 81   Resp 18   Ht 1.727 m (5\' 8" )   Wt 94 kg (207 lb 3.2 oz)   SpO2 98%   BMI 31.50 kg/m      Physical Exam  Constitutional:       General: She is not in acute distress.     Appearance: Normal appearance. She is not ill-appearing or diaphoretic.   HENT:      Head: Normocephalic and atraumatic.      Nose: No congestion.      Mouth/Throat:      Mouth: Mucous membranes are moist.   Neck:      Vascular: No carotid bruit.   Cardiovascular:      Rate and Rhythm: Normal rate and regular rhythm.      Heart sounds: Murmur  heard.      No friction rub. No gallop.      Comments: 2 x 6 systolic murmur noted in right left upper sternal borders does not worsen with Valsalva  Pulmonary:      Effort: Pulmonary effort is normal. No respiratory distress.      Breath sounds: Normal breath sounds. No stridor. No wheezing, rhonchi or rales.   Chest:      Chest wall: No tenderness.   Abdominal:      General: There is no distension.      Tenderness: There is no abdominal tenderness.   Musculoskeletal:         General: No swelling.      Right lower leg: No edema.      Left lower leg: No edema.   Skin:     General: Skin is warm and dry.   Neurological:      General: No focal deficit present.      Mental Status: She is alert. Mental status is at baseline.   Psychiatric:         Mood and Affect: Mood normal.            CARDIAC DIAGNOSTICS          EKG done today and personally viewed showed sinus rhythm normal axis normal axis intervals nonspecific ST-T changes borderline PR prolongation         ASSESSMENT AND RECOMMENDATIONS      IMPRESSION:   Cardiac murmur appears to be physiologic/flow related murmur  Cardiac evaluation for DOT physical due to heart murmur   Hypertension  Hypercholesterolemia    RECOMMENDATIONS:   Check echocardiogram  She does not require the echocardiogram prior to resuming her work/DOT physical      FOLLOW UP: As needed based on results of echocardiogram    Thank you for allowing me to participate in the care of your patient.    Earl Glassing, MD   [facility] / [facility]

## 2023-07-19 NOTE — Patient Instructions (Signed)
 Echo   Okay to resume work prior to getting Echo as the heart murmur is benign/flow murmur

## 2023-07-20 NOTE — Telephone Encounter (Signed)
 Placed DOT in your folder. Please have GFP sign and state if there are any limitations.

## 2023-07-20 NOTE — Telephone Encounter (Signed)
Filled out, signed, and faxed.

## 2023-07-21 NOTE — Telephone Encounter (Signed)
 EKG faxed to [facility]

## 2023-07-21 NOTE — Telephone Encounter (Signed)
 What is the reason for the call?    Pt stated that doctor office needs her EKG results. Please fax this information to [phone]

## 2023-07-22 NOTE — Telephone Encounter (Signed)
 PATIENT CAME IN OFFICE AND REQUESTED HER EKG TO BE PRINTED

## 2023-08-04 ENCOUNTER — Inpatient Hospital Stay: Admit: 2023-08-04 | Discharge: 2023-08-04 | Payer: PRIVATE HEALTH INSURANCE | Attending: Internal Medicine

## 2023-08-04 VITALS — BP 118/78 | Ht 67.99 in | Wt 207.2 lb

## 2023-08-04 DIAGNOSIS — I358 Other nonrheumatic aortic valve disorders: Secondary | ICD-10-CM

## 2023-08-04 DIAGNOSIS — R011 Cardiac murmur, unspecified: Secondary | ICD-10-CM

## 2023-08-05 LAB — ECHO (TTE) COMPLETE (PRN CONTRAST/BUBBLE/STRAIN/3D)
AV AT: 88 ms
AV Area by Peak Velocity: 1.7 cm2
AV Area by VTI: 1.7 cm2
AV Cusp Mmode: 1.6 cm
AV Mean Gradient: 11 mmHg
AV Mean Velocity: 1.6 m/s
AV Peak Gradient: 19 mmHg
AV Peak Velocity: 2.2 m/s
AV VTI: 38.3 cm
AV Velocity Ratio: 0.55
AVA/BSA Peak Velocity: 0.8 cm2/m2
AVA/BSA VTI: 0.8 cm2/m2
Ascending Aorta Index: 1.5 cm/m2
Ascending Aorta: 3.1 cm
Body Surface Area: 2.12 m2
E/E' Lateral: 11.07
E/E' Ratio (Averaged): 11.54
E/E' Septal: 12.01
EF BP: 64 % (ref 55–100)
EF Physician: 60 %
Est. RA Pressure: 3 mmHg
Fractional Shortening 2D: 29 % (ref 28–44)
IVSd: 1 cm — AB (ref 0.6–0.9)
IVSs: 1.2 cm
LA Area 2C: 18 cm2
LA Area 4C: 16.7 cm2
LA Major Axis: 5.4 cm
LA Minor Axis: 5.5 cm
LA Volume BP: 45 mL (ref 22–52)
LA Volume Index BP: 22 mL/m2 (ref 16–34)
LA Volume Index MOD A2C: 23 mL/m2 (ref 16–34)
LA Volume Index MOD A4C: 20 mL/m2 (ref 16–34)
LA Volume MOD A2C: 48 mL (ref 22–52)
LA Volume MOD A4C: 41 mL (ref 22–52)
LV E' Lateral Velocity: 8.49 cm/s
LV E' Septal Velocity: 7.83 cm/s
LV EDV A2C: 93 mL
LV EDV A4C: 74 mL
LV EDV Index A2C: 45 mL/m2
LV EDV Index A4C: 36 mL/m2
LV ESV A2C: 32 mL
LV ESV A4C: 29 mL
LV ESV Index A2C: 15 mL/m2
LV ESV Index A4C: 14 mL/m2
LV Ejection Fraction A2C: 66 %
LV Ejection Fraction A4C: 61 %
LV Mass 2D Index: 56.7 g/m2 (ref 43–95)
LV Mass 2D: 117.3 g (ref 67–162)
LV RWT Ratio: 0.53
LVIDd Index: 1.84 cm/m2
LVIDd: 3.8 cm — AB (ref 3.9–5.3)
LVIDs Index: 1.3 cm/m2
LVIDs: 2.7 cm
LVOT Area: 3.1 cm2
LVOT Diameter: 2 cm
LVOT Mean Gradient: 3 mmHg
LVOT Peak Gradient: 6 mmHg
LVOT Peak Velocity: 1.2 m/s
LVOT SV: 66.3 mL
LVOT Stroke Volume Index: 32 mL/m2
LVOT VTI: 21.1 cm
LVOT:AV VTI Index: 0.55
LVPWd: 1 cm — AB (ref 0.6–0.9)
LVPWs: 1.2 cm
MV A Velocity: 1.1 m/s
MV E Velocity: 0.94 m/s
MV E/A: 0.85
RA Area 4C: 9.7 cm2
RV Free Wall Peak S': 16.1 cm/s
RVIDd: 3.1 cm
RVSP: 17 mmHg
TAPSE: 2.6 cm (ref 1.7–?)
TR Max Velocity: 1.87 m/s
TR Peak Gradient: 14 mmHg

## 2023-08-08 NOTE — Telephone Encounter (Signed)
 [facility] Results request    Patient is requesting/has questions (choose/clear one) about results    What is the best # for them to reach you? [phone]  What test results do you need a call back for? ECHO   Where did you have the testing performed at? [facility]  When did you have the testing done? 08/04/23  Are you on MyChart? YES     Pt was informed thst GFP send a message in her MYCHART, but pt will like a call back for clarification about results.   "For test results we usually encourage to schedule a visit, do you already have one scheduled or can we go ahead and schedule one for you?"

## 2023-08-08 NOTE — Telephone Encounter (Signed)
Report emailed.

## 2023-08-08 NOTE — Telephone Encounter (Signed)
 Called and spoke with patient. She just needed the results and she logged into [facility] and saw them. 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    [email or web address]

## 2023-09-27 DIAGNOSIS — R102 Pelvic and perineal pain: Principal | ICD-10-CM

## 2023-09-27 NOTE — ED Provider Notes (Signed)
 [facility] EMERGENCY DEPT  EMERGENCY DEPARTMENT ENCOUNTER      Pt Name: Elizabeth Bruce  MRN: [id number]  Birthdate [birth year]  Date of evaluation: 09/27/2023  Provider: Norleen Chrissie Bowels, MD    CHIEF COMPLAINT       Chief Complaint   Patient presents with    Abdominal Cramping     Patient complains of menstrual cramping that started three hours ago. Took Ibuprofen 800mg  X 2 1 hour ago and has had no relief. Denies there being any change in her cycle other than she skipped her cycle in July.      HISTORY OF PRESENT ILLNESS    Elizabeth Bruce is a 53 y.o. female history of hypertension who presents to the emergency department lower abdominal pain cramping.  Patient feels like she is having severe menstrual cramps.  Reports she did not experience menstrual period last month.  Normally is very regular.  Denies any dysuria frequency urgency hematuria bowel movement changes.  Denies any vaginal odor or discharge.  Denies any nausea or vomiting or fever.  Took Motrin without significant relief.  Denies any pelvic pain more in the lower abdomen.  Symptom onset several hours ago.    The history is provided by the patient and medical records.       Nursing Notes were reviewed.  REVIEW OF SYSTEMS     Review of Systems    Except as noted above the remainder of the review of systems was reviewed and negative.     PAST MEDICAL HISTORY     Past Medical History:   Diagnosis Date    High blood pressure        SURGICAL HISTORY       Past Surgical History:   Procedure Laterality Date    TUBAL LIGATION         CURRENT MEDICATIONS       Previous Medications    LISINOPRIL-HYDROCHLOROTHIAZIDE  (PRINZIDE;ZESTORETIC) 20-12.5 MG PER TABLET    Take 1 tablet by mouth daily     ALLERGIES     Patient has no known allergies.  FAMILY HISTORY       Family History   Problem Relation Age of Onset    Diabetes Mother     Hypertension Mother     Gout Father       SOCIAL HISTORY       Social History     Socioeconomic History    Marital status: Single    Tobacco Use    Smoking status: Never    Smokeless tobacco: Never   Substance and Sexual Activity    Alcohol use: Yes     Comment: 2-3 times a week. 3 points. pos interpretation.     SCREENINGS     Glasgow Coma Scale  Eye Opening: Spontaneous  Best Verbal Response: Oriented  Best Motor Response: Obeys commands  Glasgow Coma Scale Score: 15       CIWA Assessment  BP: (!) 155/79  Pulse: 74           PHYSICAL EXAM       ED Triage Vitals [09/27/23 2328]   BP Girls Systolic BP Percentile Girls Diastolic BP Percentile Boys Systolic BP Percentile Boys Diastolic BP Percentile Temp Temp Source Pulse   (!) 168/97 -- -- -- -- 98.8 F (37.1 C) Oral 74      Respirations SpO2 Height Weight - Scale       16 98 % 1.727 m (5' 8) 95.3 kg (210  lb)           Physical Exam  Vitals and nursing note reviewed.   Constitutional:       General: She is not in acute distress.     Appearance: She is not ill-appearing.   HENT:      Head: Normocephalic and atraumatic.      Mouth/Throat:      Mouth: Mucous membranes are moist.   Eyes:      General: No scleral icterus.  Cardiovascular:      Rate and Rhythm: Normal rate and regular rhythm.      Pulses: Normal pulses.      Heart sounds: No murmur heard.  Pulmonary:      Effort: Pulmonary effort is normal. No respiratory distress.      Breath sounds: No stridor. No wheezing.   Abdominal:      General: Abdomen is flat.      Palpations: Abdomen is soft.      Tenderness: There is abdominal tenderness. There is no guarding.      Hernia: No hernia is present.   Musculoskeletal:         General: Normal range of motion.   Skin:     General: Skin is warm.      Capillary Refill: Capillary refill takes less than 2 seconds.      Findings: No rash.   Neurological:      Mental Status: She is alert.      GCS: GCS eye subscore is 4. GCS verbal subscore is 5. GCS motor subscore is 6.   Psychiatric:         Mood and Affect: Mood normal.         Speech: Speech normal.         Behavior: Behavior normal.          DIAGNOSTIC RESULTS   EKG: All EKG's are interpreted by the Emergency Department Physician who either signs or Co-signs this chart in the absence of a cardiologist.    none, by my interpretation    RADIOLOGY:   Non-plain film images such as CT, Ultrasound and MRI are read by the radiologist. Plain radiographic images are visualized and preliminarily interpreted by the emergency physician with the below findings:    pending, per my interpretation    Interpretation per the Radiologist below, if available at the time of this note:  No orders to display       LABS:  Labs Reviewed   CBC WITH AUTO DIFFERENTIAL - Abnormal; Notable for the following components:       Result Value    Hemoglobin 9.4 (*)     Hematocrit 29.6 (*)     MCV 78.9 (*)     MCH 25.1 (*)     All other components within normal limits   COMPREHENSIVE METABOLIC PANEL - Abnormal; Notable for the following components:    Glucose 104 (*)     Creatinine 1.2 (*)     Est, Glom Filt Rate 55 (*)     All other components within normal limits   URINALYSIS W/ RFLX MICROSCOPIC - Abnormal; Notable for the following components:    Blood, Urine Large (*)     All other components within normal limits   MICROSCOPIC URINALYSIS - Abnormal; Notable for the following components:    RBC, UA 3-5 (*)     All other components within normal limits    Narrative:     Added on by [employer]  WET PREP, GENITAL   C.TRACHOMATIS N.GONORRHOEAE DNA    Narrative:     Source:->Vaginal   SMEAR FUNGUS   LIPASE   PREGNANCY, URINE       All other labs were within normal range or not returned as of this dictation.    EMERGENCY DEPARTMENT COURSE and DIFFERENTIAL DIAGNOSIS/MDM:   Vitals:    Vitals:    09/27/23 2328 09/28/23 0000 09/28/23 0027 09/28/23 0030   BP: (!) 168/97 (!) 147/93  (!) 155/79   Pulse: 74      Resp: 16      Temp: 98.8 F (37.1 C)      TempSrc: Oral      SpO2: 98% 100% 100%    Weight: 95.3 kg (210 lb)      Height: 1.727 m (5' 8)          Differential diagnosis:  Dysmenorrhea, menstrual cramps, pyelonephritis, nephrolithiasis, UTI, fecal impaction, constipation, PID  Other testing considered:  Prior Inpatient/External notes reviewed:    Medical Decision Making  Elizabeth Bruce is a 53 y.o. female history of hypertension who presents to the emergency department lower abdominal pain cramping.  Patient presents with abdominal pain lower abdominal cramping severe attempted Motrin about significant relief.  Did not have a menstrual period last month.  Vital signs stable not in distress blood pressure 160 over 90s.  Patient given morphine  Zofran .  CBC, CMP, lipase, urinalysis, urine pregnancy.  Will likely obtain CT abdomen pelvis and perform pelvic exam.    Amount and/or Complexity of Data Reviewed  Labs: ordered. Decision-making details documented in ED Course.    Risk  Prescription drug management.          REASSESSMENT     ED Course as of 09/28/23 0048   Wed Sep 28, 2023   0046 Pelvic exam with minimal blood coming from cervix and in vaginal vault no evidence of trauma.  No cervical motion tenderness to palpation no appreciable substantial adnexal tenderness.  Exquisite tenderness over site of uterus.  Patient care discussed with Dr. Matias at [facility] who is excepted for admission via private vehicle vital signs stable patient in no acute distress significant other will take her immediately over there.  Negative pregnancy testing no signs of UTI.  H&H is stable although hemoglobin is 9 9 within the last 2 years.  No leukocytosis.  Creatinine minimally up to 1.2.  Patient will receive pelvic ultrasound and CT abdomen pelvis if unrevealing as to the cause as well as IV fluids at [facility]. [JB]      ED Course User Index  [JB] Treshun Wold, Norleen Fitzpatrick, MD           CONSULTS:  None    PROCEDURES:  Unless otherwise noted below, none     Procedures    FINAL IMPRESSION      1. Pelvic pain    2. Vaginal bleeding          DISPOSITION/PLAN   DISPOSITION Decision To Transfer  09/28/2023 12:31:26 AM   DISPOSITION CONDITION Stable           PATIENT REFERRED TO:  No follow-up provider specified.    DISCHARGE MEDICATIONS:  New Prescriptions    No medications on file          No data to display                (Please note that portions of this note were completed with a voice recognition program.  Efforts were made to edit the dictations but occasionally words are mis-transcribed.)    Elija Mccamish Brenton Michaelah Credeur, MD (electronically signed)  Attending Emergency Physician       Brice Kossman, Norleen Fitzpatrick, MD  09/28/23 [phone]

## 2023-09-28 ENCOUNTER — Inpatient Hospital Stay
Admit: 2023-09-28 | Discharge: 2023-09-28 | Disposition: A | Discharge status: Home / Self Care | Payer: PRIVATE HEALTH INSURANCE | Arrived: WI

## 2023-09-28 ENCOUNTER — Emergency Department: Admit: 2023-09-28 | Payer: PRIVATE HEALTH INSURANCE

## 2023-09-28 ENCOUNTER — Inpatient Hospital Stay
Admit: 2023-09-28 | Discharge: 2023-09-28 | Disposition: A | Discharge status: Other Acute Inpatient Hospital | Payer: PRIVATE HEALTH INSURANCE | Arrived: VH | Attending: Emergency Medicine

## 2023-09-28 DIAGNOSIS — R9389 Abnormal findings on diagnostic imaging of other specified body structures: Principal | ICD-10-CM

## 2023-09-28 LAB — COMPREHENSIVE METABOLIC PANEL
ALT: 14 U/L (ref 0–42)
AST: 19 U/L (ref 0–46)
Albumin/Globulin Ratio: 1.3 (ref 1.00–2.70)
Albumin: 4.2 g/dL (ref 3.5–5.2)
Alk Phosphatase: 81 U/L (ref 35–117)
Anion Gap: 12 mmol/L (ref 2–17)
BUN: 13 mg/dL (ref 6–20)
CO2: 24 mmol/L (ref 22–29)
Calcium: 8.9 mg/dL (ref 8.5–10.7)
Chloride: 99 mmol/L (ref 98–107)
Creatinine: 1.2 mg/dL — ABNORMAL HIGH (ref 0.5–1.0)
Est, Glom Filt Rate: 55 mL/min/1.73mÂ² — ABNORMAL LOW (ref >=60)
Globulin: 3 g/dL (ref 1.9–4.4)
Glucose: 104 mg/dL — ABNORMAL HIGH (ref 70–99)
Osmolaliy Calculated: 271 mosm/kg (ref 270–287)
Potassium: 3.7 mmol/L (ref 3.5–5.3)
Sodium: 135 mmol/L (ref 135–145)
Total Bilirubin: 0.26 mg/dL (ref 0.00–1.20)
Total Protein: 7.5 g/dL (ref 5.7–8.3)

## 2023-09-28 LAB — CBC WITH AUTO DIFFERENTIAL
Basophils %: 0.4 % (ref 0.0–2.0)
Basophils Absolute: 0 x10e3/mcL (ref 0.0–0.2)
Eosinophils %: 2.4 % (ref 0.0–7.0)
Eosinophils Absolute: 0.1 x10e3/mcL (ref 0.0–0.5)
Hematocrit: 29.6 % — ABNORMAL LOW (ref 34.0–47.0)
Hemoglobin: 9.4 g/dL — ABNORMAL LOW (ref 11.5–15.7)
Immature Grans (Abs): 0.02 x10e3/mcL (ref 0.00–0.06)
Immature Granulocytes %: 0.4 % (ref 0.0–0.6)
Lymphocytes Absolute: 2.1 x10e3/mcL (ref 1.0–3.2)
Lymphocytes: 39 % (ref 15.0–45.0)
MCH: 25.1 pg — ABNORMAL LOW (ref 27.0–34.5)
MCHC: 31.8 g/dL (ref 30.0–36.0)
MCV: 78.9 fL — ABNORMAL LOW (ref 81.0–99.0)
MPV: 10.5 fL (ref 7.0–12.2)
Monocytes %: 8.1 % (ref 4.0–12.0)
Monocytes Absolute: 0.4 x10e3/mcL (ref 0.3–1.0)
Neutrophils %: 49.7 % (ref 42.0–74.0)
Neutrophils Absolute: 2.7 x10e3/mcL (ref 1.6–7.3)
Platelets: 335 x10e3/mcL (ref 140–440)
RBC: 3.75 x10e6/mcL (ref 3.60–5.20)
RDW: 16.5 % (ref 10.0–17.0)
WBC: 5.4 x10e3/mcL (ref 3.8–10.6)

## 2023-09-28 LAB — MICROSCOPIC URINALYSIS

## 2023-09-28 LAB — URINALYSIS W/ RFLX MICROSCOPIC
Bilirubin, Urine: NEGATIVE
Glucose, Ur: NEGATIVE
Ketones, Urine: NEGATIVE
Leukocyte Esterase, Urine: NEGATIVE
Nitrite, Urine: NEGATIVE
Protein, UA: NEGATIVE
Specific Gravity, UA: <=1.005 (ref 1.003–1.035)
Urobilinogen, Urine: 0.2 EU/dL (ref 0.2–1.0)
pH, Urine: 6 (ref 4.5–8.0)

## 2023-09-28 LAB — SMEAR FUNGUS: FINAL REPORT: NONE SEEN

## 2023-09-28 LAB — WET PREP, GENITAL
RBC, Wet Prep: >5 — AB
Trich, Wet Prep: ABSENT

## 2023-09-28 LAB — LIPASE: Lipase: 26 U/L (ref 13–60)

## 2023-09-28 LAB — PREGNANCY, URINE: Pregnancy, Urine: NEGATIVE

## 2023-09-28 MED ORDER — MORPHINE SULFATE (PF) 4 MG/ML IJ SOLN
4 | Freq: Once | INTRAMUSCULAR | Status: AC
Start: 2023-09-28 — End: 2023-09-27
  Administered 2023-09-28: 04:00:00 4 mg via INTRAVENOUS

## 2023-09-28 MED ORDER — ONDANSETRON HCL 4 MG/2ML IJ SOLN
4 | Freq: Once | INTRAMUSCULAR | Status: AC
Start: 2023-09-28 — End: 2023-09-27
  Administered 2023-09-28: 04:00:00 4 mg via INTRAVENOUS

## 2023-09-28 MED ORDER — KETOROLAC TROMETHAMINE 15 MG/ML IJ SOLN
15 | Freq: Once | INTRAMUSCULAR | Status: AC
Start: 2023-09-28 — End: 2023-09-28
  Administered 2023-09-28: 07:00:00 15 mg via INTRAVENOUS

## 2023-09-28 MED ORDER — MORPHINE SULFATE (PF) 4 MG/ML IJ SOLN
4 | INTRAMUSCULAR | Status: AC
Start: 2023-09-28 — End: 2023-09-28
  Administered 2023-09-28: 07:00:00 2 mg via INTRAVENOUS

## 2023-09-28 MED ORDER — SODIUM CHLORIDE 0.9 % IV BOLUS
0.9 | Freq: Once | INTRAVENOUS | Status: DC
Start: 2023-09-28 — End: 2023-09-27

## 2023-09-28 MED ORDER — IBUPROFEN 800 MG PO TABS
800 | ORAL_TABLET | Freq: Three times a day (TID) | ORAL | 0 refills | 15.00000 days | Status: AC | PRN
Start: 2023-09-28 — End: ?

## 2023-09-28 MED ORDER — TRAMADOL HCL 50 MG PO TABS
50 | ORAL_TABLET | ORAL | 0 refills | 30.00000 days | Status: AC | PRN
Start: 2023-09-28 — End: 2023-10-03

## 2023-09-28 MED ORDER — ONDANSETRON HCL 4 MG/2ML IJ SOLN
4 | Freq: Once | INTRAMUSCULAR | Status: AC
Start: 2023-09-28 — End: 2023-09-28
  Administered 2023-09-28: 07:00:00 4 mg via INTRAVENOUS

## 2023-09-28 MED FILL — ONDANSETRON HCL 4 MG/2ML IJ SOLN: 4 MG/2ML | INTRAMUSCULAR | Qty: 2 | Fill #0

## 2023-09-28 MED FILL — MORPHINE SULFATE (PF) 4 MG/ML IJ SOLN: 4 mg/mL | INTRAMUSCULAR | Qty: 1 | Fill #0

## 2023-09-28 MED FILL — KETOROLAC TROMETHAMINE 15 MG/ML IJ SOLN: 15 mg/mL | INTRAMUSCULAR | Qty: 1 | Fill #0

## 2023-09-28 NOTE — ED Provider Notes (Signed)
 [facility] EMERGENCY DEPT  EMERGENCY DEPARTMENT ENCOUNTER      Pt Name: Elizabeth Bruce  MRN: [id number]  Birthdate [birth year]  Date of evaluation: 09/28/2023  Provider: Maude Gloor VALDEZ Kyia Rhude, PA-C  4:15 AM    CHIEF COMPLAINT       Chief Complaint   Patient presents with    Abdominal Pain     [facility] sent [facility] for ultrasound r/t lower intermittent abdominal pain x6hrs, states started her period 8/12.         HISTORY OF PRESENT ILLNESS    Elizabeth Bruce is a 53 y.o. female who presents to the emergency department     53 y/o AAF with c/o pelvic pain and vaginal bleeding.  Patient was initially evaluated in [facility], but advised to come to this ED facility for a pelvic ultrasound.   She's started to have irregular menstrual cycles over the last few months.  States having a regular menstrual cycle for a few months, then occasionally skip a cycle, with the assumption that she was in perimenopause.  Today, she developed vaginal bleeding with severe menstrual cramping that was not typical with her cycles.  Labs and pelvic examination were performed in the other facility.  Urine HCG was negative.  She has seen Dr. Savannah on a GYN standpoint in the past    The history is provided by the patient.       Nursing Notes were reviewed.    REVIEW OF SYSTEMS       Review of Systems   Constitutional: Negative.    HENT: Negative.     Respiratory: Negative.     Cardiovascular: Negative.    Genitourinary:  Positive for pelvic pain and vaginal bleeding.   Musculoskeletal: Negative.    Neurological: Negative.    Psychiatric/Behavioral: Negative.         Except as noted above the remainder of the review of systems was reviewed and negative.       PAST MEDICAL HISTORY     Past Medical History:   Diagnosis Date    High blood pressure          SURGICAL HISTORY       Past Surgical History:   Procedure Laterality Date    TUBAL LIGATION           CURRENT MEDICATIONS       Discharge Medication List as of 09/28/2023  2:55 AM         CONTINUE these medications which have NOT CHANGED    Details   lisinopril-hydroCHLOROthiazide  (PRINZIDE;ZESTORETIC) 20-12.5 MG per tablet Take 1 tablet by mouth dailyHistorical Med             ALLERGIES     Patient has no known allergies.    FAMILY HISTORY       Family History   Problem Relation Age of Onset    Diabetes Mother     Hypertension Mother     Gout Father           SOCIAL HISTORY       Social History     Socioeconomic History    Marital status: Single   Tobacco Use    Smoking status: Never    Smokeless tobacco: Never   Substance and Sexual Activity    Alcohol use: Yes     Comment: 2-3 times a week. 3 points. pos interpretation.       SCREENINGS  Glasgow Coma Scale  Eye Opening: Spontaneous  Best Verbal Response: Oriented  Best Motor Response: Obeys commands  Glasgow Coma Scale Score: 15                     CIWA Assessment  BP: 122/77  Pulse: 67                 PHYSICAL EXAM       ED Triage Vitals [09/28/23 0130]   BP Girls Systolic BP Percentile Girls Diastolic BP Percentile Boys Systolic BP Percentile Boys Diastolic BP Percentile Temp Temp Source Pulse   123/82 -- -- -- -- 98.1 F (36.7 C) Oral 72      Respirations SpO2 Height Weight - Scale       18 100 % 1.727 m (5' 8) 95.3 kg (210 lb)           Physical Exam  Vitals and nursing note reviewed.   Constitutional:       Appearance: She is well-developed.   HENT:      Head: Normocephalic and atraumatic.   Pulmonary:      Effort: Pulmonary effort is normal. No respiratory distress.   Abdominal:      Palpations: Abdomen is soft.      Tenderness: There is abdominal tenderness in the suprapubic area. There is no guarding.   Skin:     Findings: No rash.   Neurological:      Mental Status: She is alert and oriented to person, place, and time.   Psychiatric:         Mood and Affect: Mood normal.         Behavior: Behavior normal.         DIAGNOSTIC RESULTS         RADIOLOGY:   Non-plain film images such as CT, Ultrasound and MRI are  read by the radiologist. Plain radiographic images are visualized and preliminarily interpreted by the emergency physician with the below findings:        Interpretation per the Radiologist below, if available at the time of this note:    US  NON OB TRANSVAGINAL   Preliminary Result      Endometrium is distended with complex material measuring up to 21 mm in total    (including the complex material).  Possible 5 mm polyp along the posterior    endometrium, although no associated vascularity seen.  The endocervical canal is    also significantly distended with complex material measuring up to 23 mm.  No    significant endometrial or endocervical vascularity is appreciated.  Findings    suggest presence of a large amount of hemorrhagic fluid in the endometrial and    endocervical canals.  While no vascularity is detected, possibility of an    underlying hypovascular mass is not excluded.  Recommend gynecology referral for    further management.  Tissue sampling may be warranted.      There is a 1.7 cm hypoechoic left ovarian lesion, possibly a hemorrhagic cyst.     Suggest attention on future follow-up.   Right ovary is unremarkable.   Vascular flow is demonstrated in both ovaries.      No significant free fluid.            Report finalized/faxed at 2:23 AM ET. If there are any questions, please contact    [facility] at [phone].            Lamar Shivers, M.D.  This report has been electronically signed and verified by the Radiologist whose    name is printed above.          This report contains privileged and confidential information and is intended    solely for the use of the individual or entity to which it is addressed. If you    are not the intended recipient of this report, you are hereby notified that any    copying, distribution, dissemination or action taken in relation to the contents    of this report is strictly prohibited and may be unlawful. If you have received    this report in error,  please notify the sender immediately at [phone] and    permanently delete the original report and destroy any copies or printouts.               US  DUP ABD PEL RETRO SCROT LIMITED    (Results Pending)         ED BEDSIDE ULTRASOUND:   Performed by ED Physician - none    LABS:  Labs Reviewed - No data to display    All other labs were within normal range or not returned as of this dictation.    EMERGENCY DEPARTMENT COURSE and DIFFERENTIAL DIAGNOSIS/MDM:   Vitals:    Vitals:    09/28/23 0130 09/28/23 0305 09/28/23 0307   BP: 123/82  122/77   Pulse: 72  67   Resp: 18 18 16    Temp: 98.1 F (36.7 C)     TempSrc: Oral     SpO2: 100%  100%   Weight: 95.3 kg (210 lb)     Height: 1.727 m (5' 8)             Medical Decision Making  Patient is hemodynamically stable with improved pain after IV medications.  US  had shown an abnormal thickened endometrium. Advised close f/u wth GYN for biopsy and further evaluation and management.     Risk  Prescription drug management.            FINAL IMPRESSION      1. Endometrial thickening on ultrasound    2. Abnormal vaginal bleeding    3. Cyst of left ovary          DISPOSITION/PLAN   DISPOSITION Decision To Discharge 09/28/2023 02:55:16 AM   DISPOSITION CONDITION Stable           PATIENT REFERRED TO:  Johnie JAYSON Land  [address]  [place]  [phone]  [phone]  Schedule an appointment as soon as possible for a visit   (with Dr. Marylu Ozell Dubonnet)    Levern Karna Garret, MD  [facility] [address]  [address]  [place] [address]  [phone]    Schedule an appointment as soon as possible for a visit       [facility] EMERGENCY DEPT  [address]  [place]  [address]  [phone]    As needed, If symptoms worsen      DISCHARGE MEDICATIONS:  Discharge Medication List as of 09/28/2023  2:55 AM        START taking these medications    Details   ibuprofen (ADVIL;MOTRIN) 800 MG tablet Take 1 tablet by mouth every 8 hours as needed for Pain,  Disp-20 tablet, R-0Print      traMADol (ULTRAM) 50 MG tablet Take 1 tablet by mouth every 6-8 hours as needed for Pain for up to 5 days. Intended supply: 3 days.  Take lowest dose possible to manage pain Max Daily Amount: 200 mg, Disp-15 tablet, R-0Print           Controlled Substances Monitoring:          No data to display                (Please note that portions of this note were completed with a voice recognition program.  Efforts were made to edit the dictations but occasionally words are mis-transcribed.)    Char Feltman VALDEZ Ryelynn Guedea, PA-C (electronically signed)  Attending Emergency Physician           Orion Zebedee Cahill, PA-C  09/28/23 0414       Orion Zebedee Cahill, PA-C  09/28/23 [phone]

## 2023-09-28 NOTE — Other (Signed)
 Patient was recently assessed and found to be at or close to baseline. Patient ambulated to the lobby without assistance. Patient was in NO acute distress and was stable on discharge. Patient understood discharge instructions and did not have any further questions.

## 2023-09-29 LAB — C.TRACHOMATIS N.GONORRHOEAE DNA
Chlamydia trachomatis, NAA: NEGATIVE
Neisseria Gonorrhoeae, NAA: NEGATIVE

## 2024-01-13 ENCOUNTER — Encounter
